# Patient Record
Sex: Female | Born: 1990 | Race: Black or African American | Hispanic: No | Marital: Single | Smoking: Never smoker
Health system: Southern US, Community
[De-identification: ages and names within clinical notes are randomized; demographics above are authoritative.]

## PROBLEM LIST (undated history)

## (undated) DIAGNOSIS — O039 Complete or unspecified spontaneous abortion without complication: Secondary | ICD-10-CM

## (undated) DIAGNOSIS — N289 Disorder of kidney and ureter, unspecified: Secondary | ICD-10-CM

## (undated) DIAGNOSIS — R718 Other abnormality of red blood cells: Secondary | ICD-10-CM

## (undated) HISTORY — PX: OTHER SURGICAL HISTORY: SHX169

---

## 2012-02-29 DIAGNOSIS — Z8614 Personal history of Methicillin resistant Staphylococcus aureus infection: Secondary | ICD-10-CM | POA: Insufficient documentation

## 2017-08-19 ENCOUNTER — Encounter (HOSPITAL_COMMUNITY): Payer: Self-pay

## 2017-08-19 ENCOUNTER — Emergency Department (HOSPITAL_COMMUNITY)
Admission: EM | Admit: 2017-08-19 | Discharge: 2017-08-19 | Disposition: A | Discharge status: Home / Self Care | Payer: Self-pay | Attending: Emergency Medicine | Admitting: Emergency Medicine

## 2017-08-19 DIAGNOSIS — M5441 Lumbago with sciatica, right side: Secondary | ICD-10-CM

## 2017-08-19 DIAGNOSIS — M5432 Sciatica, left side: Secondary | ICD-10-CM | POA: Insufficient documentation

## 2017-08-19 DIAGNOSIS — M545 Low back pain: Secondary | ICD-10-CM | POA: Insufficient documentation

## 2017-08-19 DIAGNOSIS — M5442 Lumbago with sciatica, left side: Secondary | ICD-10-CM

## 2017-08-19 DIAGNOSIS — R809 Proteinuria, unspecified: Secondary | ICD-10-CM | POA: Insufficient documentation

## 2017-08-19 DIAGNOSIS — M5431 Sciatica, right side: Secondary | ICD-10-CM | POA: Insufficient documentation

## 2017-08-19 LAB — URINALYSIS, ROUTINE W REFLEX MICROSCOPIC
BACTERIA UA: NONE SEEN
BILIRUBIN URINE: NEGATIVE
Glucose, UA: NEGATIVE mg/dL
HGB URINE DIPSTICK: NEGATIVE
Ketones, ur: NEGATIVE mg/dL
LEUKOCYTES UA: NEGATIVE
NITRITE: NEGATIVE
Specific Gravity, Urine: 1.018 (ref 1.005–1.030)
pH: 8 (ref 5.0–8.0)

## 2017-08-19 LAB — POC URINE PREG, ED: Preg Test, Ur: NEGATIVE

## 2017-08-19 MED ORDER — IBUPROFEN 800 MG PO TABS: 800.0000 mg | ORAL_TABLET | Freq: Three times a day (TID) | ORAL | 0 refills | Status: DC | PRN

## 2017-08-19 NOTE — ED Provider Notes (Signed)
[facility]-EMERGENCY DEPT Provider Note   CSN: [id number] Arrival date & time: 08/19/17  [id number]   History   Chief Complaint No chief complaint on file.  HPI  Ms. Diane Thomas is a 26yo female with no relevant PMH who presents with right lower back and bilateral leg numbness since yesterday evening. She reports that the right lower back pain woke her up from sleep last night. She went back to sleep and then woke up again feeling like her legs were completely numb and weak. She could not ambulate until a couple hours later when her leg numbness resolved and numbness now on bilateral hips. Describes the back pain as sharp and achy. Does endorse some lower abdominal pain. She denies fevers, chest pain, nausea/vomiting, or shortness of breath. Has been able to tolerate PO intake. Has frequent UTIs and did feel burning with urination, urinary frequency, and urinary urgency last week and took AZO for 1 week (last dose Thursday). The symptoms resolved. She has also had a kidney infection in the past and she states these symptoms are kind of similar.  Has had chronic intermittent headaches, blurry vision, and polydipsia and polyuria. Dad has diabetes. States she is supposed to be worked up for diabetes but she does not have a PCP and they have trouble getting blood so she has not had this done yet. States she has chronic proteinuria as well. Has been taking excedrin recently for the headaches with some relief.  Denies smoking or other illicit drugs. Endorses occasional alcohol use (~1x/month). 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  History reviewed. No pertinent past medical history.  There are no active problems to display for this patient.   History reviewed. No pertinent surgical history.  OB History    No data available       Home Medications    Prior to Admission medications   Medication Sig Start Date End Date Taking? Authorizing Provider  AZO-CRANBERRY PO Take 1 tablet by mouth daily as needed (cramps).   Yes  [provider]  ibuprofen (ADVIL,MOTRIN) 800 MG tablet Take 1 tablet (800 mg total) by mouth every 8 (eight) hours as needed. 08/19/17   Scherrie GerlachHuang, Ellen Goris, MD    Family History No family history on file.  Social History Social History  Substance Use Topics  . Smoking status: Never Smoker  . Smokeless tobacco: Never Used  . Alcohol use Not on file     Allergies   Sulfa antibiotics   Review of Systems Review of Systems  Negative except as stated above in HPI.  Physical Exam Updated Vital Signs BP (!) 150/90   Pulse 88   Temp 98.1 F (36.7 C) (Oral)   Resp 18   SpO2 100%   Physical Exam  GEN: Well-appearing, obese, pleasant young female sitting in bed in NAD. Alert and oriented. HENT: Moist mucous membranes. No visible lesions. EYES: PERRL. Sclera anicteric. RESP: Clear to auscultation bilaterally. No wheezes, rales, or rhonchi. CV: Normal rate and regular rhythm. No murmurs, gallops, or rubs. No LE edema. BACK: Mild soreness to palpation on R lower back. No CVA tenderness. Negative straight leg raise test. ABD: Soft. Non-tender. Non-distended. Normoactive bowel sounds. EXT: No edema. Warm and well perfused. NEURO: Cranial nerves II-XII grossly intact. 5/5 LE strength. Decreased sensation in right thigh and bilateral hips. Normal proprioception bilaterally. Speech fluent and appropriate. PSYCH: Patient is calm and pleasant. Appropriate affect. Well-groomed; speech is appropriate and on-subject.  ED Treatments / Results  Labs (all labs ordered are listed, but only  abnormal results are displayed) Labs Reviewed  URINALYSIS, ROUTINE W REFLEX MICROSCOPIC - Abnormal; Notable for the following:       Result Value   Protein, ur >=300 (*)    Squamous Epithelial / LPF 0-5 (*)    All other components within normal limits  POC URINE PREG, ED    EKG  EKG Interpretation None       Radiology No results found.  Procedures Procedures (including critical care  time)  Medications Ordered in ED Medications - No data to display   Initial Impression / Assessment and Plan / ED Course  I have reviewed the triage vital signs and the nursing notes.  Pertinent labs & imaging results that were available during my care of the patient were reviewed by me and considered in my medical decision making (see chart for details).  Diane Thomas is a 26yo female with frequent UTIs and prior pyelonephritis who had right lower back pain and bilateral leg numbness that started yesterday. It sounds like she had a UTI a week ago and self-treated with AZO with resolution of her urinary symptoms. No CVA tenderness or urinary symptoms now either. Patient afebrile and denies nausea. Able to tolerate PO intake. Less concerned for pyelonephritis, but will check UA. May be MSK pain or ?related to diabetes. Patient also states that she just recently eliminated pork and beef from her diet. Another concern is folate/B12 deficiency however I think this is very unlikely. She can get a work-up for this outpatient if indicated.  She also has had chronic headaches, intermittent blurry vision, polydipsia and polyuria. She states this has been going on for as long as she can remember. Dad has diabetes and she is supposed to be worked up for diabetes, but has not had this done yet. She likely does have diabetes, and will need work-up outpatient. Does not have a PCP.  10:52am UA negative for UTI, positive for proteinuria (chronic). Reassured that we do not feel that this is a kidney infection at this point. Provided ibuprofen  q8h PRN for pain and advised of return precautions. She really needs an outpatient primary doctor for further evaluation for this back pain and diabetes. Patient is stable for discharge.  Patient seen and discussed with Dr. Criss Alvine.  Final Clinical Impressions(s) / ED Diagnoses   Final diagnoses:  Proteinuria, unspecified type  Acute right-sided low back pain with  bilateral sciatica    New Prescriptions New Prescriptions   IBUPROFEN (ADVIL,MOTRIN) 800 MG TABLET    Take 1 tablet (800 mg total) by mouth every 8 (eight) hours as needed.     Scherrie Gerlach, MD 08/19/17 1054    Pricilla Loveless, MD 08/19/17 [phone]

## 2017-08-19 NOTE — ED Notes (Signed)
Education given to patient regarding establishing primary care and getting screening for diabetes. Resources given to patient.

## 2017-08-19 NOTE — Discharge Instructions (Signed)
You do not have a urinary tract infection. We are not sure what is causing your back pain, but you can take ibuprofen  three times a day as needed for the pain. It is important that you follow up with a primary care doctor for further evaluation of this back pain as well as getting labs for diabetes. Please make an appointment with a primary care doctor as soon as you can.  If you start having fevers, chills, nausea, vomiting, and cannot tolerate oral intake, please call for help.

## 2017-08-19 NOTE — ED Triage Notes (Signed)
Patient complains of back pain and bilateral leg pain since yesterday, thinsk she may have uti. NAD

## 2017-10-24 ENCOUNTER — Other Ambulatory Visit: Payer: Self-pay | Admitting: Nephrology

## 2017-10-24 DIAGNOSIS — R809 Proteinuria, unspecified: Secondary | ICD-10-CM

## 2017-10-28 ENCOUNTER — Ambulatory Visit
Admission: RE | Admit: 2017-10-28 | Discharge: 2017-10-28 | Disposition: A | Discharge status: Home / Self Care | Payer: BLUE CROSS/BLUE SHIELD | Source: Ambulatory Visit | Attending: Nephrology | Admitting: Nephrology

## 2017-10-28 DIAGNOSIS — R809 Proteinuria, unspecified: Secondary | ICD-10-CM

## 2017-11-24 ENCOUNTER — Other Ambulatory Visit (HOSPITAL_COMMUNITY): Payer: Self-pay | Admitting: Nephrology

## 2017-11-24 DIAGNOSIS — R809 Proteinuria, unspecified: Secondary | ICD-10-CM

## 2017-12-03 ENCOUNTER — Inpatient Hospital Stay (HOSPITAL_COMMUNITY): Payer: BLUE CROSS/BLUE SHIELD

## 2017-12-03 ENCOUNTER — Encounter (HOSPITAL_COMMUNITY): Payer: Self-pay

## 2017-12-03 ENCOUNTER — Inpatient Hospital Stay (HOSPITAL_COMMUNITY)
Admission: AD | Admit: 2017-12-03 | Discharge: 2017-12-03 | Disposition: A | Discharge status: Home / Self Care | Payer: BLUE CROSS/BLUE SHIELD | Source: Ambulatory Visit | Attending: Family Medicine | Admitting: Family Medicine

## 2017-12-03 DIAGNOSIS — O3680X0 Pregnancy with inconclusive fetal viability, not applicable or unspecified: Secondary | ICD-10-CM

## 2017-12-03 DIAGNOSIS — O0901 Supervision of pregnancy with history of infertility, first trimester: Secondary | ICD-10-CM | POA: Insufficient documentation

## 2017-12-03 DIAGNOSIS — O26891 Other specified pregnancy related conditions, first trimester: Secondary | ICD-10-CM | POA: Insufficient documentation

## 2017-12-03 DIAGNOSIS — R109 Unspecified abdominal pain: Secondary | ICD-10-CM | POA: Insufficient documentation

## 2017-12-03 DIAGNOSIS — Z3A00 Weeks of gestation of pregnancy not specified: Secondary | ICD-10-CM | POA: Insufficient documentation

## 2017-12-03 DIAGNOSIS — O209 Hemorrhage in early pregnancy, unspecified: Secondary | ICD-10-CM | POA: Diagnosis not present

## 2017-12-03 DIAGNOSIS — N289 Disorder of kidney and ureter, unspecified: Secondary | ICD-10-CM | POA: Insufficient documentation

## 2017-12-03 DIAGNOSIS — O9989 Other specified diseases and conditions complicating pregnancy, childbirth and the puerperium: Secondary | ICD-10-CM | POA: Diagnosis not present

## 2017-12-03 HISTORY — DX: Disorder of kidney and ureter, unspecified: N28.9

## 2017-12-03 HISTORY — DX: Other abnormality of red blood cells: R71.8

## 2017-12-03 LAB — URINALYSIS, ROUTINE W REFLEX MICROSCOPIC
Bilirubin Urine: NEGATIVE
Glucose, UA: NEGATIVE mg/dL
Ketones, ur: NEGATIVE mg/dL
Nitrite: NEGATIVE
PH: 5 (ref 5.0–8.0)
Protein, ur: >=300 mg/dL — AB
SPECIFIC GRAVITY, URINE: 1.018 (ref 1.005–1.030)

## 2017-12-03 LAB — ABO/RH: ABO/RH(D): O POS

## 2017-12-03 LAB — WET PREP, GENITAL
CLUE CELLS WET PREP: NONE SEEN
SPERM: NONE SEEN
TRICH WET PREP: NONE SEEN
YEAST WET PREP: NONE SEEN

## 2017-12-03 LAB — CBC
HEMATOCRIT: 33.8 % — AB (ref 36.0–46.0)
Hemoglobin: 10.5 g/dL — ABNORMAL LOW (ref 12.0–15.0)
MCH: 22.8 pg — ABNORMAL LOW (ref 26.0–34.0)
MCHC: 31.1 g/dL (ref 30.0–36.0)
MCV: 73.3 fL — AB (ref 78.0–100.0)
PLATELETS: 299 10*3/uL (ref 150–400)
RBC: 4.61 MIL/uL (ref 3.87–5.11)
RDW: 16.7 % — ABNORMAL HIGH (ref 11.5–15.5)
WBC: 8.3 10*3/uL (ref 4.0–10.5)

## 2017-12-03 LAB — HCG, QUANTITATIVE, PREGNANCY: hCG, Beta Chain, Quant, S: 169 m[IU]/mL — ABNORMAL HIGH (ref <5)

## 2017-12-03 LAB — POCT PREGNANCY, URINE: PREG TEST UR: POSITIVE — AB

## 2017-12-03 NOTE — MAU Provider Note (Signed)
Chief Complaint: Abdominal Pain and Vaginal Bleeding   First Provider Initiated Contact with Patient 12/03/17 1105      SUBJECTIVE HPI: Diane Thomas is a 26 y.o. G2P0010 at [redacted]w[redacted]d by LMP who presents to maternity admissions reporting onset of cramping 4 days ago and vaginal  bleeding yesterday.  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  Her periods are irregular. She had a regular period in November and then 3 days of light bleeding 12/13-15.  Last week she had a positive home pregnancy test and confirmed this with an urgent care visit with positive test.  The cramping started 4 days ago and is light, intermittent, low in her abdomen and in the middle and is described as menstrual cramping. The bleeding started yesterday, 12/25, and was light only when wiping. She did notice heavier bright red bleeding last night after a bowel movement but it has return to light this morning.  She has not tried any treatments.  She denies vaginal itching/burning, urinary symptoms, h/a, dizziness, n/v, or fever/chills.     HPI  Past Medical History:  Diagnosis Date  . Abnormal RBC    states it is low  . Kidney disease    Past Surgical History:  Procedure Laterality Date  . boil removed     Social History   Socioeconomic History  . Marital status: Single    Spouse name: Not on file  . Number of children: Not on file  . Years of education: Not on file  . Highest education level: Not on file  Social Needs  . Financial resource strain: Not on file  . Food insecurity - worry: Not on file  . Food insecurity - inability: Not on file  . Transportation needs - medical: Not on file  . Transportation needs - non-medical: Not on file  Occupational History  . Not on file  Tobacco Use  . Smoking status: Never Smoker  . Smokeless tobacco: Never Used  Substance and Sexual Activity  . Alcohol use: No    Frequency: Never  . Drug use: No  . Sexual activity: Yes    Birth control/protection: None   Other Topics Concern  . Not on file  Social History Narrative  . Not on file   No current facility-administered medications on file prior to encounter.    Current Outpatient Medications on File Prior to Encounter  Medication Sig Dispense Refill  . Prenatal Vit-Fe Fumarate-FA (PRENATAL MULTIVITAMIN) TABS tablet Take 1 tablet by mouth daily at 12 noon.    Marland Kitchen ibuprofen (ADVIL,MOTRIN) 800 MG tablet Take 1 tablet (800 mg total) by mouth every 8 (eight) hours as needed. (Patient not taking: Reported on 11/28/2017) 30 tablet 0   Allergies  Allergen Reactions  . Sulfa Antibiotics Rash    ROS:  Review of Systems  Constitutional: Negative for chills, fatigue and fever.  Respiratory: Negative for shortness of breath.   Cardiovascular: Negative for chest pain.  Gastrointestinal: Negative for nausea and vomiting.  Genitourinary: Positive for pelvic pain and vaginal bleeding. Negative for difficulty urinating, dysuria, flank pain, vaginal discharge and vaginal pain.  Neurological: Negative for dizziness and headaches.  Psychiatric/Behavioral: Negative.      I have reviewed patient's Past Medical Hx, Surgical Hx, Family Hx, Social Hx, medications and allergies.   Physical Exam   Patient Vitals for the past 24 hrs:  BP Temp Temp src Pulse Resp Height Weight  12/03/17 1434 133/82 - - - - - -  12/03/17 1057 140/80  98.2 F (36.8 C) Oral 91 18 - -  12/03/17 1036 - - - - - 5\' 6"  (1.676 m) (!) 314 lb 4 oz (142.5 kg)   Constitutional: Well-developed, well-nourished female in no acute distress.  Cardiovascular: normal rate Respiratory: normal effort GI: Abd soft, non-tender. Pos BS x 4 MS: Extremities nontender, no edema, normal ROM Neurologic: Alert and oriented x 4.  GU: Neg CVAT.  PELVIC EXAM: Cervix pink, visually closed, without lesion, small amount pink bleeding with mucus, vaginal walls and external genitalia normal Bimanual exam: Cervix 0/long/high, firm, anterior, neg CMT,  technically difficult exam due to body habitus but no palpable abnormalities  LAB RESULTS Results for orders placed or performed during the hospital encounter of 12/03/17 (from the past 24 hour(s))  Urinalysis, Routine w reflex microscopic     Status: Abnormal   Collection Time: 12/03/17 10:35 AM  Result Value Ref Range   Color, Urine YELLOW YELLOW   APPearance HAZY (A) CLEAR   Specific Gravity, Urine 1.018 1.005 - 1.030   pH 5.0 5.0 - 8.0   Glucose, UA NEGATIVE NEGATIVE mg/dL   Hgb urine dipstick LARGE (A) NEGATIVE   Bilirubin Urine NEGATIVE NEGATIVE   Ketones, ur NEGATIVE NEGATIVE mg/dL   Protein, ur >=811 (A) NEGATIVE mg/dL   Nitrite NEGATIVE NEGATIVE   Leukocytes, UA TRACE (A) NEGATIVE   RBC / HPF TOO NUMEROUS TO COUNT 0 - 5 RBC/hpf   WBC, UA 6-30 0 - 5 WBC/hpf   Bacteria, UA RARE (A) NONE SEEN   Squamous Epithelial / LPF 6-30 (A) NONE SEEN   Mucus PRESENT   Pregnancy, urine POC     Status: Abnormal   Collection Time: 12/03/17 10:44 AM  Result Value Ref Range   Preg Test, Ur POSITIVE (A) NEGATIVE  Wet prep, genital     Status: Abnormal   Collection Time: 12/03/17 11:10 AM  Result Value Ref Range   Yeast Wet Prep HPF POC NONE SEEN NONE SEEN   Trich, Wet Prep NONE SEEN NONE SEEN   Clue Cells Wet Prep HPF POC NONE SEEN NONE SEEN   WBC, Wet Prep HPF POC MANY (A) NONE SEEN   Sperm NONE SEEN   CBC     Status: Abnormal   Collection Time: 12/03/17 11:48 AM  Result Value Ref Range   WBC 8.3 4.0 - 10.5 K/uL   RBC 4.61 3.87 - 5.11 MIL/uL   Hemoglobin 10.5 (L) 12.0 - 15.0 g/dL   HCT 91.4 (L) 78.2 - 95.6 %   MCV 73.3 (L) 78.0 - 100.0 fL   MCH 22.8 (L) 26.0 - 34.0 pg   MCHC 31.1 30.0 - 36.0 g/dL   RDW 21.3 (H) 08.6 - 57.8 %   Platelets 299 150 - 400 K/uL  hCG, quantitative, pregnancy     Status: Abnormal   Collection Time: 12/03/17 11:48 AM  Result Value Ref Range   hCG, Beta Chain, Quant, S 169 (H) <5 mIU/mL  ABO/Rh     Status: None   Collection Time: 12/03/17 11:48 AM   Result Value Ref Range   ABO/RH(D) O POS     --/--/O POS (12/26 1148)  IMAGING US Ob Comp Less 14 Wks  Result Date: 12/03/2017 CLINICAL DATA:  Lower abdominal cramping EXAM: OBSTETRIC <14 WK [place] AND TRANSVAGINAL OB US TECHNIQUE: Both transabdominal and transvaginal ultrasound examinations were performed for complete evaluation of the gestation as well as the maternal uterus, adnexal regions, and pelvic cul-de-sac. Transvaginal technique was performed to assess  early pregnancy. COMPARISON:  None. FINDINGS: Intrauterine gestational sac: None visualized Yolk sac:  None visualized Embryo:  None visualized Cardiac Activity: Heart Rate:   bpm MSD:   mm    w     d CRL:    mm    w    d                  US EDC: Subchorionic hemorrhage:  None visualized. Maternal uterus/adnexae: No adnexal mass or free fluid. IMPRESSION: No intrauterine pregnancy visualized. Differential considerations would include early intrauterine pregnancy too early to visualize, spontaneous abortion, or occult ectopic pregnancy. Recommend close clinical followup and serial quantitative beta HCGs and ultrasounds. Electronically Signed   By: Charlett NoseKevin  Dover M.D.   On: 12/03/2017 13:10   [place] Ob Transvaginal  Result Date: 12/03/2017 CLINICAL DATA:  Lower abdominal cramping EXAM: OBSTETRIC <14 WK US AND TRANSVAGINAL OB US TECHNIQUE: Both transabdominal and transvaginal ultrasound examinations were performed for complete evaluation of the gestation as well as the maternal uterus, adnexal regions, and pelvic cul-de-sac. Transvaginal technique was performed to assess early pregnancy. COMPARISON:  None. FINDINGS: Intrauterine gestational sac: None visualized Yolk sac:  None visualized Embryo:  None visualized Cardiac Activity: Heart Rate:   bpm MSD:   mm    w     d CRL:    mm    w    d                  US EDC: Subchorionic hemorrhage:  None visualized. Maternal uterus/adnexae: No adnexal mass or free fluid. IMPRESSION: No intrauterine pregnancy  visualized. Differential considerations would include early intrauterine pregnancy too early to visualize, spontaneous abortion, or occult ectopic pregnancy. Recommend close clinical followup and serial quantitative beta HCGs and ultrasounds. Electronically Signed   By: Charlett NoseKevin  Dover M.D.   On: 12/03/2017 13:10    MAU Management/MDM: Ordered labs and reviewed results.  Findings today could represent a normal early pregnancy, spontaneous abortion or ectopic pregnancy which can be life-threatening.  Ectopic precautions were given to the patient with plan to return in 48 hours for repeat quant hcg to evaluate pregnancy development.  [facility] 8 am on 12/05/17 for repeat hcg.  Pt with >300 protein in her urine today and borderline BP. She reports her primary care referred her to nephrology for protein in urine x 1 year. Nephrology wants a renal biopsy but because of pregnancy test positive, cancelled her scheduled biopsy. Pt to follow up with nephrology and primary care after Friday's lab visit and results.  Pt discharged with strict ectopic and bleeding precautions.  ASSESSMENT 1. Pregnancy of unknown anatomic location   2. Vaginal bleeding in pregnancy, first trimester     PLAN Discharge home Allergies as of 12/03/2017      Reactions   Sulfa Antibiotics Rash      Medication List    STOP taking these medications   aspirin-acetaminophen-caffeine 250-250-65 MG tablet Commonly known as:  EXCEDRIN MIGRAINE   MIDOL MAX ST MENSTRUAL 500-60-15 MG Tabs Generic drug:  Acetaminophen-Caff-Pyrilamine     TAKE these medications   prenatal multivitamin Tabs tablet Take 1 tablet by mouth daily at 12 noon.     ASK your doctor about these medications   ibuprofen 800 MG tablet Commonly known as:  ADVIL,MOTRIN Take 1 tablet (800 mg total) by mouth every 8 (eight) hours as needed.      Follow-up Information  [facility]   Specialty:   Obstetrics and Gynecology Why:  [facility] to the office on the ground floor for repeat labs on 12/05/17 at 8 am.  You will stay for 1-2 hours for results. Return to MAU with any emergencies. Contact information: [address] [place] [id number] [phone]          Sharen CounterLisa Leftwich-Kirby Certified Nurse-Midwife 12/03/2017  9:09 PM

## 2017-12-03 NOTE — MAU Note (Signed)
Took home pregnancy test last week. States she has fertility issues and was told she couldn't get pregnant. Started seeing some spotting when wiped yesterday was pink, in the middle of the night when she went to the bathroom she saw a darker red blood in the toilet, now back to pink. Cramping since saturday

## 2017-12-04 LAB — HIV ANTIBODY (ROUTINE TESTING W REFLEX): HIV Screen 4th Generation wRfx: NONREACTIVE

## 2017-12-04 LAB — GC/CHLAMYDIA PROBE AMP (~~LOC~~) NOT AT ARMC
CHLAMYDIA, DNA PROBE: NEGATIVE
Neisseria Gonorrhea: NEGATIVE

## 2017-12-05 ENCOUNTER — Ambulatory Visit (HOSPITAL_COMMUNITY)
Admission: RE | Admit: 2017-12-05 | Discharge: 2017-12-05 | Disposition: A | Discharge status: Home / Self Care | Payer: BLUE CROSS/BLUE SHIELD | Source: Ambulatory Visit | Attending: Nephrology | Admitting: Nephrology

## 2017-12-05 ENCOUNTER — Ambulatory Visit (INDEPENDENT_AMBULATORY_CARE_PROVIDER_SITE_OTHER): Payer: BLUE CROSS/BLUE SHIELD | Admitting: *Deleted

## 2017-12-05 ENCOUNTER — Encounter (HOSPITAL_COMMUNITY): Payer: Self-pay

## 2017-12-05 DIAGNOSIS — Z3490 Encounter for supervision of normal pregnancy, unspecified, unspecified trimester: Secondary | ICD-10-CM

## 2017-12-05 DIAGNOSIS — O3680X0 Pregnancy with inconclusive fetal viability, not applicable or unspecified: Secondary | ICD-10-CM

## 2017-12-05 LAB — HCG, QUANTITATIVE, PREGNANCY: hCG, Beta Chain, Quant, S: 91 m[IU]/mL — ABNORMAL HIGH (ref <5)

## 2017-12-05 NOTE — Progress Notes (Signed)
Chart reviewed for nurse visit. Agree with plan of care.   Sharyon CableRogers, Zo Loudon C, CNM 12/05/2017 10:35 AM

## 2017-12-05 NOTE — Progress Notes (Signed)
Here for stat bhcg. Denies bleeding or pain. Explained will draw blood and run stat and have her wait for results , about 1.5 hours. Then review with provider and her. She voices understanding.   Reviewed results with Steward DroneVeronica Rogers, CNM and patient. Informed her appears she has had a miscarriage and plan is to come back 1 week for routine bhcg, 2 weeks for provider visit for follow up.  Ectopic precautions given. Support given. She voices understanding.

## 2017-12-12 ENCOUNTER — Other Ambulatory Visit: Payer: BLUE CROSS/BLUE SHIELD

## 2017-12-12 DIAGNOSIS — O3680X0 Pregnancy with inconclusive fetal viability, not applicable or unspecified: Secondary | ICD-10-CM

## 2017-12-12 NOTE — Addendum Note (Signed)
Addended by: Lorelle GibbsWILSON, Channing Savich L on: 12/12/2017 09:20 AM   Modules accepted: Orders

## 2017-12-13 LAB — BETA HCG QUANT (REF LAB): HCG QUANT: 3 m[IU]/mL

## 2017-12-16 ENCOUNTER — Other Ambulatory Visit (HOSPITAL_COMMUNITY): Payer: Self-pay | Admitting: Nephrology

## 2017-12-16 DIAGNOSIS — R809 Proteinuria, unspecified: Secondary | ICD-10-CM

## 2017-12-29 ENCOUNTER — Ambulatory Visit: Payer: BLUE CROSS/BLUE SHIELD | Admitting: Family Medicine

## 2017-12-29 ENCOUNTER — Encounter: Payer: Self-pay | Admitting: Family Medicine

## 2017-12-29 VITALS — BP 126/82 | HR 82 | Ht 66.0 in | Wt 312.3 lb

## 2017-12-29 DIAGNOSIS — O039 Complete or unspecified spontaneous abortion without complication: Secondary | ICD-10-CM

## 2017-12-29 NOTE — Progress Notes (Addendum)
   Subjective:    Patient ID: Diane Thomas, female    DOB: Mar 12, 1991, 27 y.o.   MRN: [id number]  HPI Seen for follow up of SAB two weeks ago. No bleeding, cramping. Menses have not returned yet. Not planing on becoming pregnant soon.    Review of Systems     Objective:   Physical Exam  Constitutional: She is oriented to person, place, and time. She appears well-developed and well-nourished.  Cardiovascular: Normal rate.  Abdominal: Soft. She exhibits no distension.  Neurological: She is alert and oriented to person, place, and time.  Skin: Skin is warm and dry.  Psychiatric: She has a normal mood and affect. Her behavior is normal. Judgment and thought content normal.      Assessment & Plan:  1. SAB (spontaneous abortion) Counseled pt not to become pregnant over the next 3 months. Pt declined contraception - will use condoms  2. Morbid obesity (HCC) Discussed weight loss, DM screening.

## 2017-12-30 ENCOUNTER — Other Ambulatory Visit: Payer: Self-pay | Admitting: Student

## 2017-12-30 ENCOUNTER — Ambulatory Visit (HOSPITAL_COMMUNITY): Payer: BLUE CROSS/BLUE SHIELD

## 2017-12-31 ENCOUNTER — Other Ambulatory Visit: Payer: Self-pay | Admitting: Radiology

## 2018-01-01 ENCOUNTER — Other Ambulatory Visit: Payer: Self-pay | Admitting: Radiology

## 2018-01-02 ENCOUNTER — Encounter (HOSPITAL_COMMUNITY): Payer: Self-pay

## 2018-01-02 ENCOUNTER — Ambulatory Visit (HOSPITAL_COMMUNITY)
Admission: RE | Admit: 2018-01-02 | Discharge: 2018-01-02 | Disposition: A | Discharge status: Home / Self Care | Payer: BLUE CROSS/BLUE SHIELD | Source: Ambulatory Visit | Attending: Nephrology | Admitting: Nephrology

## 2018-01-02 DIAGNOSIS — Z882 Allergy status to sulfonamides status: Secondary | ICD-10-CM | POA: Insufficient documentation

## 2018-01-02 DIAGNOSIS — N189 Chronic kidney disease, unspecified: Secondary | ICD-10-CM | POA: Insufficient documentation

## 2018-01-02 DIAGNOSIS — R809 Proteinuria, unspecified: Secondary | ICD-10-CM | POA: Insufficient documentation

## 2018-01-02 DIAGNOSIS — Z79899 Other long term (current) drug therapy: Secondary | ICD-10-CM | POA: Insufficient documentation

## 2018-01-02 LAB — COMPREHENSIVE METABOLIC PANEL
ALT: 16 U/L (ref 14–54)
AST: 16 U/L (ref 15–41)
Albumin: 3.4 g/dL — ABNORMAL LOW (ref 3.5–5.0)
Alkaline Phosphatase: 56 U/L (ref 38–126)
Anion gap: 12 (ref 5–15)
BUN: 17 mg/dL (ref 6–20)
CO2: 20 mmol/L — AB (ref 22–32)
CREATININE: 0.87 mg/dL (ref 0.44–1.00)
Calcium: 9 mg/dL (ref 8.9–10.3)
Chloride: 106 mmol/L (ref 101–111)
GFR calc non Af Amer: >60 mL/min (ref >60)
Glucose, Bld: 102 mg/dL — ABNORMAL HIGH (ref 65–99)
Potassium: 4.3 mmol/L (ref 3.5–5.1)
SODIUM: 138 mmol/L (ref 135–145)
Total Bilirubin: 0.4 mg/dL (ref 0.3–1.2)
Total Protein: 7 g/dL (ref 6.5–8.1)

## 2018-01-02 LAB — CBC
HEMATOCRIT: 37.2 % (ref 36.0–46.0)
HEMOGLOBIN: 11.5 g/dL — AB (ref 12.0–15.0)
MCH: 23.4 pg — ABNORMAL LOW (ref 26.0–34.0)
MCHC: 30.9 g/dL (ref 30.0–36.0)
MCV: 75.8 fL — ABNORMAL LOW (ref 78.0–100.0)
Platelets: 265 10*3/uL (ref 150–400)
RBC: 4.91 MIL/uL (ref 3.87–5.11)
RDW: 18.5 % — AB (ref 11.5–15.5)
WBC: 8.5 10*3/uL (ref 4.0–10.5)

## 2018-01-02 LAB — PROTIME-INR
INR: 0.97
PROTHROMBIN TIME: 12.8 s (ref 11.4–15.2)

## 2018-01-02 LAB — APTT: APTT: 36 s (ref 24–36)

## 2018-01-02 LAB — PREGNANCY, URINE: PREG TEST UR: NEGATIVE

## 2018-01-02 MED ORDER — FENTANYL CITRATE (PF) 100 MCG/2ML IJ SOLN
INTRAMUSCULAR | Status: AC
  Filled 2018-01-02: qty 2

## 2018-01-02 MED ORDER — MIDAZOLAM HCL 2 MG/2ML IJ SOLN
INTRAMUSCULAR | Status: AC
  Filled 2018-01-02: qty 2

## 2018-01-02 MED ORDER — SODIUM CHLORIDE 0.9 % IV SOLN
INTRAVENOUS | Status: AC | PRN
  Administered 2018-01-02: 10 mL/h via INTRAVENOUS

## 2018-01-02 MED ORDER — MIDAZOLAM HCL 2 MG/2ML IJ SOLN
INTRAMUSCULAR | Status: AC | PRN
  Administered 2018-01-02 (×2): 1 mg via INTRAVENOUS
  Administered 2018-01-02 (×2): 0.5 mg via INTRAVENOUS

## 2018-01-02 MED ORDER — FENTANYL CITRATE (PF) 100 MCG/2ML IJ SOLN
INTRAMUSCULAR | Status: AC | PRN
  Administered 2018-01-02: 50 ug via INTRAVENOUS
  Administered 2018-01-02 (×2): 25 ug via INTRAVENOUS

## 2018-01-02 MED ORDER — LIDOCAINE HCL (PF) 1 % IJ SOLN
INTRAMUSCULAR | Status: AC
  Filled 2018-01-02: qty 30

## 2018-01-02 MED ORDER — SODIUM CHLORIDE 0.9 % IV SOLN: INTRAVENOUS | Status: DC

## 2018-01-02 NOTE — Sedation Documentation (Signed)
Patient is resting comfortably. 

## 2018-01-02 NOTE — Discharge Instructions (Addendum)

## 2018-01-02 NOTE — H&P (Signed)
Chief Complaint: Patient was seen in consultation today for random renal biopsy at the request of Dunham,Cynthia  Referring Physician(s): Dunham,Cynthia  Supervising Physician: Irish LackYamagata, Glenn  Patient Status: [facility]  History of Present Illness: Diane Thomas is a 27 y.o. female   Proteinuria for years New onset leg swelling; numbness Referred to Dr Eliott Nineunham Now scheduled for random renal bx   Past Medical History:  Diagnosis Date  . Abnormal RBC    states it is low  . Kidney disease     Past Surgical History:  Procedure Laterality Date  . boil removed      Allergies: Sulfa antibiotics  Medications: Prior to Admission medications   Medication Sig Start Date End Date Taking? Authorizing Provider  Biotin 5 MG TABS Take 1 tablet by mouth daily.   Yes [provider]     Family History  Problem Relation Age of Onset  . Diabetes Father   . Cancer Maternal Uncle   . Cancer Maternal Grandmother     Social History   Socioeconomic History  . Marital status: Single    Spouse name: None  . Number of children: None  . Years of education: None  . Highest education level: None  Social Needs  . Financial resource strain: None  . Food insecurity - worry: None  . Food insecurity - inability: None  . Transportation needs - medical: None  . Transportation needs - non-medical: None  Occupational History  . None  Tobacco Use  . Smoking status: Never Smoker  . Smokeless tobacco: Never Used  Substance and Sexual Activity  . Alcohol use: No    Frequency: Never  . Drug use: No  . Sexual activity: Yes    Birth control/protection: None  Other Topics Concern  . None  Social History Narrative  . None    Review of Systems: A 12 point ROS discussed and pertinent positives are indicated in the HPI above.  All other systems are negative.  Review of Systems  Constitutional: Negative for activity change, fatigue and fever.  Respiratory: Negative for  cough.   Cardiovascular: Negative for chest pain.  Gastrointestinal: Negative for abdominal pain.  Musculoskeletal: Negative for back pain and gait problem.  Neurological: Negative for weakness.  Psychiatric/Behavioral: Negative for behavioral problems and confusion.    Vital Signs: BP 108/84   Pulse 87   Temp 98.3 F (36.8 C) (Oral)   Resp 20   Ht 5\' 6"  (1.676 m)   Wt (!) 312 lb (141.5 kg)   LMP 12/02/2017   SpO2 98%   BMI 50.36 kg/m   Physical Exam  Constitutional: She is oriented to person, place, and time.  Cardiovascular: Normal rate, regular rhythm and normal heart sounds.  Pulmonary/Chest: Effort normal and breath sounds normal. She has no wheezes.  Abdominal: Soft. Bowel sounds are normal.  Musculoskeletal: Normal range of motion.  Neurological: She is alert and oriented to person, place, and time.  Skin: Skin is warm and dry.  Psychiatric: She has a normal mood and affect. Her behavior is normal. Judgment and thought content normal.  Nursing note and vitals reviewed.   Imaging: [place] Ob Comp Less 14 Wks  Result Date: 12/03/2017 CLINICAL DATA:  Lower abdominal cramping EXAM: OBSTETRIC <14 WK US AND TRANSVAGINAL OB US TECHNIQUE: Both transabdominal and transvaginal ultrasound examinations were performed for complete evaluation of the gestation as well as the maternal uterus, adnexal regions, and pelvic cul-de-sac. Transvaginal technique was performed to assess early pregnancy.  COMPARISON:  None. FINDINGS: Intrauterine gestational sac: None visualized Yolk sac:  None visualized Embryo:  None visualized Cardiac Activity: Heart Rate:   bpm MSD:   mm    w     d CRL:    mm    w    d                  [place] EDC: Subchorionic hemorrhage:  None visualized. Maternal uterus/adnexae: No adnexal mass or free fluid. IMPRESSION: No intrauterine pregnancy visualized. Differential considerations would include early intrauterine pregnancy too early to visualize, spontaneous abortion, or occult  ectopic pregnancy. Recommend close clinical followup and serial quantitative beta HCGs and ultrasounds. Electronically Signed   By: Charlett Nose M.D.   On: 12/03/2017 13:10   US Ob Transvaginal  Result Date: 12/03/2017 CLINICAL DATA:  Lower abdominal cramping EXAM: OBSTETRIC <14 WK [place] AND TRANSVAGINAL OB US TECHNIQUE: Both transabdominal and transvaginal ultrasound examinations were performed for complete evaluation of the gestation as well as the maternal uterus, adnexal regions, and pelvic cul-de-sac. Transvaginal technique was performed to assess early pregnancy. COMPARISON:  None. FINDINGS: Intrauterine gestational sac: None visualized Yolk sac:  None visualized Embryo:  None visualized Cardiac Activity: Heart Rate:   bpm MSD:   mm    w     d CRL:    mm    w    d                  [place] EDC: Subchorionic hemorrhage:  None visualized. Maternal uterus/adnexae: No adnexal mass or free fluid. IMPRESSION: No intrauterine pregnancy visualized. Differential considerations would include early intrauterine pregnancy too early to visualize, spontaneous abortion, or occult ectopic pregnancy. Recommend close clinical followup and serial quantitative beta HCGs and ultrasounds. Electronically Signed   By: Charlett Nose M.D.   On: 12/03/2017 13:10    Labs:  CBC: Recent Labs    12/03/17 1148  WBC 8.3  HGB 10.5*  HCT 33.8*  PLT 299    COAGS: No results for input(s): INR, APTT in the last 8760 hours.  BMP: No results for input(s): NA, K, CL, CO2, GLUCOSE, BUN, CALCIUM, CREATININE, GFRNONAA, GFRAA in the last 8760 hours.  Invalid input(s): CMP  LIVER FUNCTION TESTS: No results for input(s): BILITOT, AST, ALT, ALKPHOS, PROT, ALBUMIN in the last 8760 hours.  TUMOR MARKERS: No results for input(s): AFPTM, CEA, CA199, CHROMGRNA in the last 8760 hours.  Assessment and Plan:  Long standing proteinuria Now for random renal biopsy per Nephrology Risks and benefits discussed with the patient including, but  not limited to bleeding, infection, damage to adjacent structures or low yield requiring additional tests. All of the patient's questions were answered, patient is agreeable to proceed. Consent signed and in chart.   Thank you for this interesting consult.  I greatly enjoyed meeting Diane Thomas and look forward to participating in their care.  A copy of this report was sent to the requesting provider on this date.  Electronically Signed: Robet Leu, PA-C 01/02/2018, 7:13 AM   I spent a total of  30 Minutes   in face to face in clinical consultation, greater than 50% of which was counseling/coordinating care for random renal bx

## 2018-01-02 NOTE — Procedures (Signed)
Interventional Radiology Procedure Note  Procedure: Ultrasound guided renal biopsy  Complications: None  Estimated Blood Loss: < 10 mL  Findings: 16 G core biopsy x 2 of left renal cortex. Bedrest x 4 hours.  Jodi MarbleGlenn T. Fredia SorrowYamagata, M.D Pager:  [phone]

## 2018-01-13 ENCOUNTER — Encounter (HOSPITAL_COMMUNITY): Payer: Self-pay

## 2018-09-02 ENCOUNTER — Ambulatory Visit (HOSPITAL_COMMUNITY)
Admission: EM | Admit: 2018-09-02 | Discharge: 2018-09-02 | Disposition: A | Discharge status: Home / Self Care | Payer: BLUE CROSS/BLUE SHIELD | Attending: Family Medicine | Admitting: Family Medicine

## 2018-09-02 ENCOUNTER — Encounter (HOSPITAL_COMMUNITY): Payer: Self-pay | Admitting: Emergency Medicine

## 2018-09-02 ENCOUNTER — Other Ambulatory Visit: Payer: Self-pay

## 2018-09-02 DIAGNOSIS — N898 Other specified noninflammatory disorders of vagina: Secondary | ICD-10-CM | POA: Diagnosis not present

## 2018-09-02 DIAGNOSIS — R109 Unspecified abdominal pain: Secondary | ICD-10-CM | POA: Diagnosis not present

## 2018-09-02 DIAGNOSIS — Z79899 Other long term (current) drug therapy: Secondary | ICD-10-CM | POA: Diagnosis not present

## 2018-09-02 DIAGNOSIS — Z8614 Personal history of Methicillin resistant Staphylococcus aureus infection: Secondary | ICD-10-CM | POA: Insufficient documentation

## 2018-09-02 DIAGNOSIS — R350 Frequency of micturition: Secondary | ICD-10-CM | POA: Diagnosis not present

## 2018-09-02 DIAGNOSIS — Z3202 Encounter for pregnancy test, result negative: Secondary | ICD-10-CM | POA: Diagnosis not present

## 2018-09-02 LAB — POCT URINALYSIS DIP (DEVICE)
BILIRUBIN URINE: NEGATIVE
GLUCOSE, UA: NEGATIVE mg/dL
Ketones, ur: NEGATIVE mg/dL
LEUKOCYTES UA: NEGATIVE
Nitrite: NEGATIVE
Protein, ur: >=300 mg/dL — AB
UROBILINOGEN UA: 0.2 mg/dL (ref 0.0–1.0)
pH: 5.5 (ref 5.0–8.0)

## 2018-09-02 LAB — POCT PREGNANCY, URINE: Preg Test, Ur: NEGATIVE

## 2018-09-02 NOTE — Discharge Instructions (Signed)
Urine did not show signs of infection today. We are testing you for Gonorrhea, Chlamydia, Trichomonas, Yeast and Bacterial Vaginosis. We will call you if anything is positive and let you know if you require any further treatment. Please inform partners of any positive results.   Please return if symptoms not improving with treatment, development of fever, nausea, vomiting, abdominal pain.

## 2018-09-02 NOTE — ED Triage Notes (Signed)
Pt reports urinary frequency, vaginal discharge and abdominal cramping x5 days.  Pt would like to be screened for STD's as well as a UTI.

## 2018-09-03 NOTE — ED Provider Notes (Signed)
[facility]    CSN: [id number] Arrival date & time: 09/02/18  1928     History   Chief Complaint Chief Complaint  Patient presents with  . Vaginal Discharge  . Abdominal Cramping  . Urinary Frequency    HPI Diane Thomas is a 27 y.o. female no contributing past medical history presenting today for evaluation of possible UTI/STD screening.  Patient states that couple days ago she developed abdominal pressure in her lower abdominal area.  Since this has resolved, but she is also noticed a discharge that has a slight yellow discoloration.  She is also had urinary frequency.  She notes that she has a history of frequent UTIs and initially thought her symptoms were related to this.  But she would also like to be screened for STDs.  Last menstrual period was approximately 9/6, patient is not on any form of birth control.  Denies fever, nausea, vomiting.  Denies changes in bowel movements.  HPI  Past Medical History:  Diagnosis Date  . Abnormal RBC    states it is low  . Kidney disease     Patient Active Problem List   Diagnosis Date Noted  . 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 boil removed      OB History    Gravida  2   Para      Term      Preterm      AB  1   Living        SAB  1   TAB      Ectopic      Multiple      Live Births               Home Medications    Prior to Admission medications   Medication Sig Start Date End Date Taking? Authorizing Provider  amLODipine (NORVASC) 5 MG tablet Take 5 mg by mouth 2 (two) times daily. 08/19/18  Yes [provider]  atorvastatin (LIPITOR) 20 MG tablet Take 20 mg by mouth daily. 08/14/18  Yes [provider]  labetalol (NORMODYNE) 100 MG tablet Take 100 mg by mouth daily. 08/13/18  Yes [provider]  valACYclovir (VALTREX) 1000 MG tablet as needed. 08/04/18  Yes [provider]  Biotin 5 MG TABS Take 1 tablet  by mouth daily.    [provider]    Family History Family History  Problem Relation Age of Onset  . Diabetes Father   . Cancer Maternal Uncle   . Cancer Maternal Grandmother     Social History Social History   Tobacco Use  . Smoking status: Never Smoker  . Smokeless tobacco: Never Used  Substance Use Topics  . Alcohol use: No    Frequency: Never  . Drug use: No     Allergies   Lisinopril and Sulfa antibiotics   Review of Systems Review of Systems  Constitutional: Negative for fever.  Respiratory: Negative for shortness of breath.   Cardiovascular: Negative for chest pain.  Gastrointestinal: Positive for abdominal pain. Negative for diarrhea, nausea and vomiting.  Genitourinary: Positive for frequency and vaginal discharge. Negative for dysuria, flank pain, genital sores, hematuria, menstrual problem, urgency, vaginal bleeding and vaginal pain.  Musculoskeletal: Negative for back pain.  Skin: Negative for rash.  Neurological: Negative for dizziness, light-headedness and headaches.     Physical Exam Triage Vital Signs ED Triage Vitals [09/02/18 2001]  Enc Vitals Group  BP 130/86     Pulse Rate 77     Resp      Temp 98.6 F (37 C)     Temp Source Oral     SpO2 100 %     Weight      Height      Head Circumference      Peak Flow      Pain Score 0     Pain Loc      Pain Edu?      Excl. in GC?    No data found.  Updated Vital Signs BP 130/86 (BP Location: Left Arm)   Pulse 77   Temp 98.6 F (37 C) (Oral)   LMP 08/10/2018 (Exact Date)   SpO2 100%   Visual Acuity Right Eye Distance:   Left Eye Distance:   Bilateral Distance:    Right Eye Near:   Left Eye Near:    Bilateral Near:     Physical Exam  Constitutional: She is oriented to person, place, and time. She appears well-developed and well-nourished.  No acute distress  HENT:  Head: Normocephalic and atraumatic.  Nose: Nose normal.  Eyes: Conjunctivae are normal.  Neck:  Neck supple.  Cardiovascular: Normal rate.  Pulmonary/Chest: Effort normal. No respiratory distress.  Abdominal: Soft. She exhibits no distension. There is no tenderness.  Nontender to light deep palpation throughout all 4 quadrants of abdomen  Genitourinary:  Genitourinary Comments: Normal female external genitalia, whitish-yellowish discharge in vaginal vault, cervix nontender  Musculoskeletal: Normal range of motion.  Neurological: She is alert and oriented to person, place, and time.  Skin: Skin is warm and dry.  Psychiatric: She has a normal mood and affect.  Nursing note and vitals reviewed.    UC Treatments / Results  Labs (all labs ordered are listed, but only abnormal results are displayed) Labs Reviewed  POCT URINALYSIS DIP (DEVICE) - Abnormal; Notable for the following components:      Result Value   Hgb urine dipstick SMALL (*)    Protein, ur >=300 (*)    All other components within normal limits  POCT PREGNANCY, URINE  CERVICOVAGINAL ANCILLARY ONLY    EKG None  Radiology No results found.  Procedures Procedures (including critical care time)  Medications Ordered in UC Medications - No data to display  Initial Impression / Assessment and Plan / UC Course  I have reviewed the triage vital signs and the nursing notes.  Pertinent labs & imaging results that were available during my care of the patient were reviewed by me and considered in my medical decision making (see chart for details).     Urine negative for signs of infection, vaginal swab obtained to send off to check for STDs as well as yeast and BV as cause of discharge.  Patient wanting to hold off on treatment until results obtained.  We will call patient with results and alter treatment as needed.  Her abdominal discomfort as relatively resolved, but advised her to return if developing worsening abdominal pain, fever, nausea, vomiting.Discussed strict return precautions. Patient verbalized  understanding and is agreeable with plan.  Final Clinical Impressions(s) / UC Diagnoses   Final diagnoses:  Vaginal discharge     Discharge Instructions     Urine did not show signs of infection today. We are testing you for Gonorrhea, Chlamydia, Trichomonas, Yeast and Bacterial Vaginosis. We will call you if anything is positive and let you know if you require any further treatment. Please inform partners  of any positive results.   Please return if symptoms not improving with treatment, development of fever, nausea, vomiting, abdominal pain.    ED Prescriptions    None     Controlled Substance Prescriptions [place] Controlled Substance Registry consulted? Not Applicable   Lew Dawes, [place] 09/03/18 1458

## 2018-09-04 LAB — CERVICOVAGINAL ANCILLARY ONLY
BACTERIAL VAGINITIS: POSITIVE — AB
CHLAMYDIA, DNA PROBE: NEGATIVE
Candida vaginitis: NEGATIVE
NEISSERIA GONORRHEA: NEGATIVE
TRICH (WINDOWPATH): NEGATIVE

## 2018-09-07 ENCOUNTER — Telehealth (HOSPITAL_COMMUNITY): Payer: Self-pay | Admitting: Emergency Medicine

## 2018-09-07 MED ORDER — METRONIDAZOLE 500 MG PO TABS: 500.0000 mg | ORAL_TABLET | Freq: Two times a day (BID) | ORAL | 0 refills | Status: AC

## 2018-09-07 NOTE — Telephone Encounter (Signed)
Patient tested positive for bacterial vaginosis, will call patient to inform patient of results, metronidazole sent to her pharmacy.

## 2019-02-16 IMAGING — US US OB TRANSVAGINAL
1 series · 15 of 28 positions shown · non-contrast
Comparison: None.

CLINICAL DATA: Lower abdominal cramping

EXAM:
OBSTETRIC <14 WK US AND TRANSVAGINAL OB US
TECHNIQUE: Both transabdominal and transvaginal ultrasound examinations were
performed for complete evaluation of the gestation as well as the
maternal uterus, adnexal regions, and pelvic cul-de-sac.
Transvaginal technique was performed to assess early pregnancy.

[Series 1: us ob transvaginal · 48 acquisitions, 15 frames shown]
[im 1/48]
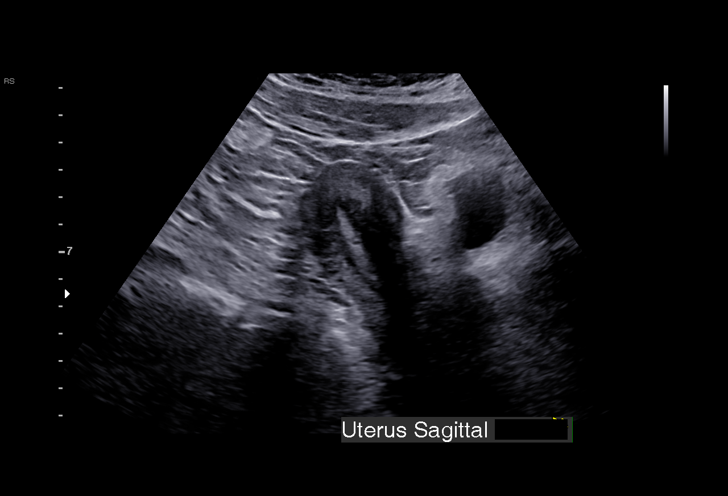
[im 4/48]
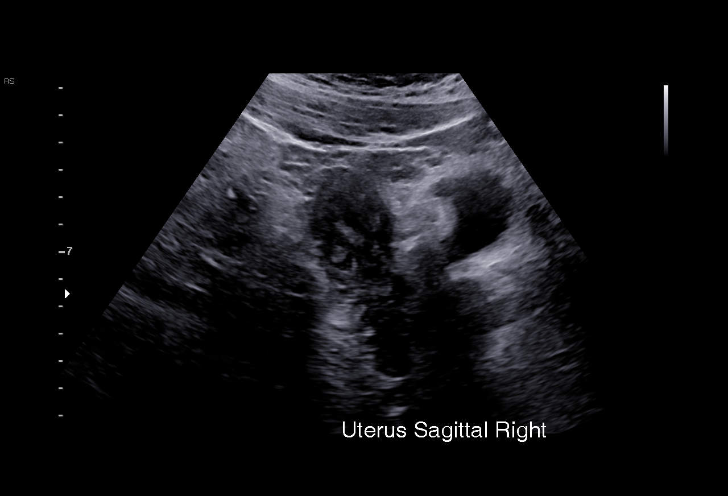
[im 7/48]
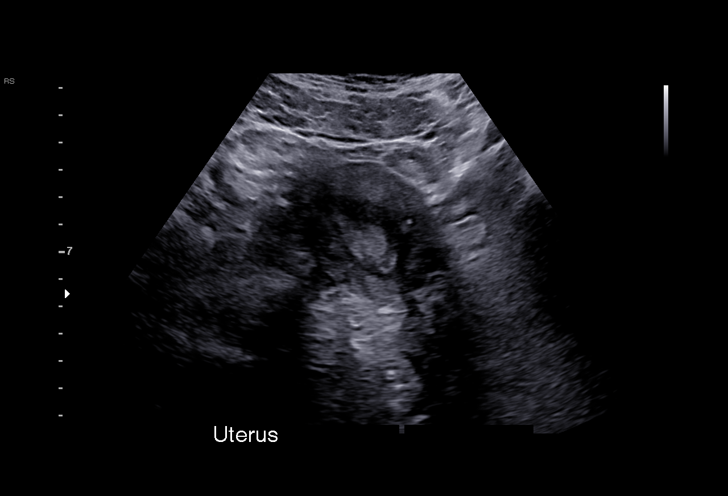
[im 11/48]
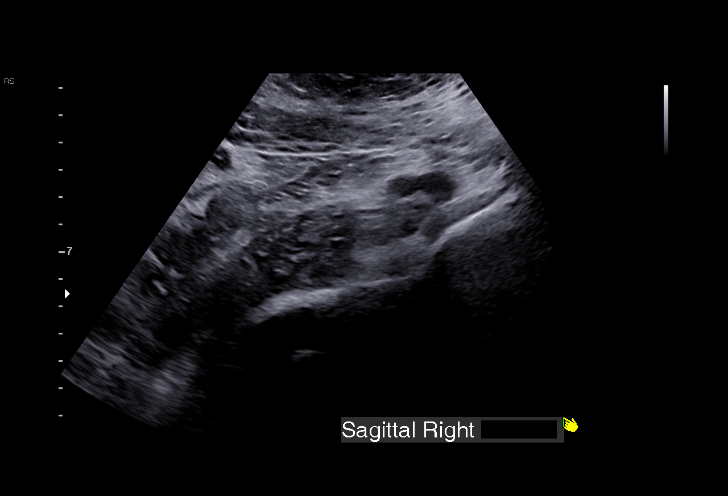
[im 14/48]
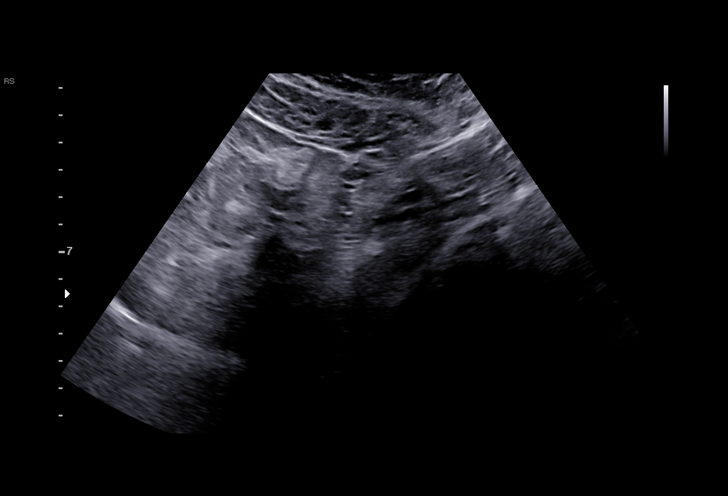
[im 18/48]
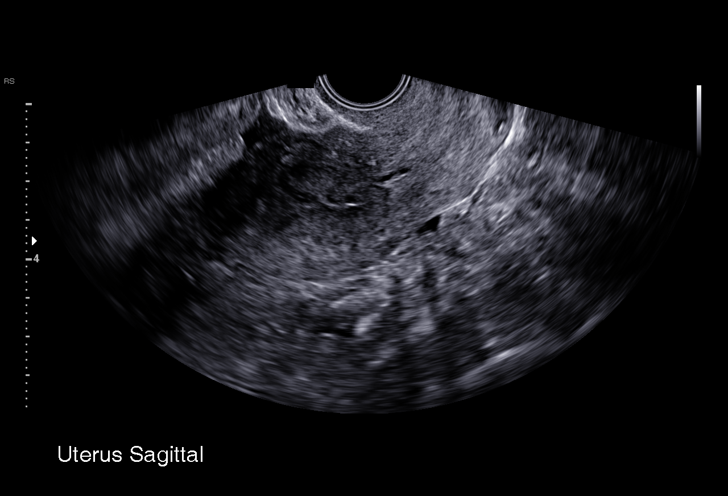
[im 21/48]
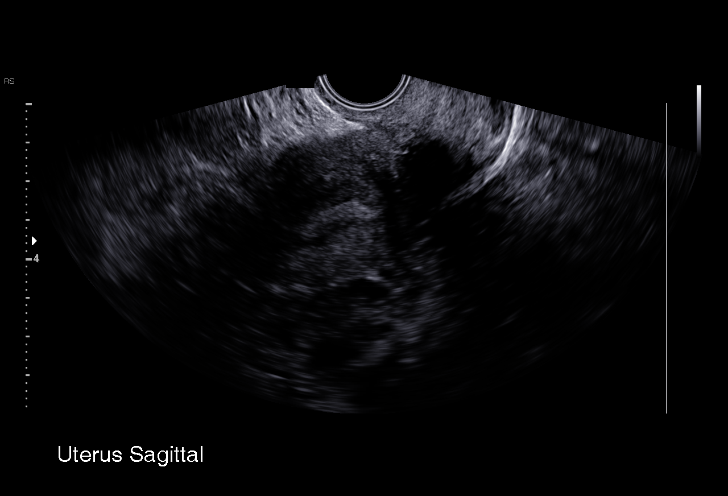
[im 25/48]
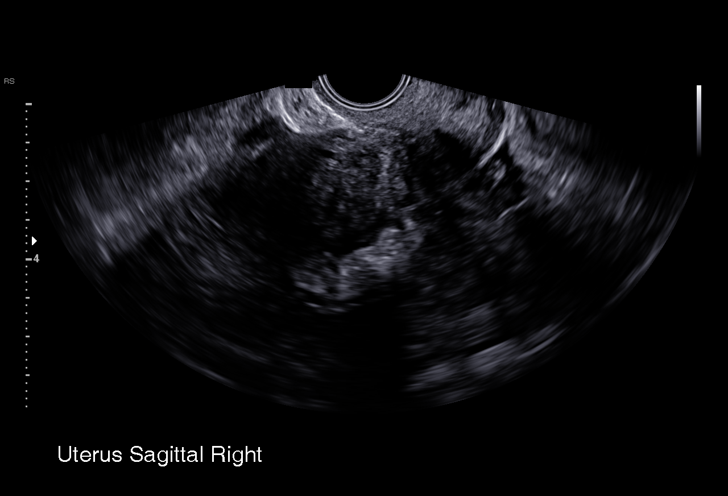
[im 27/48]
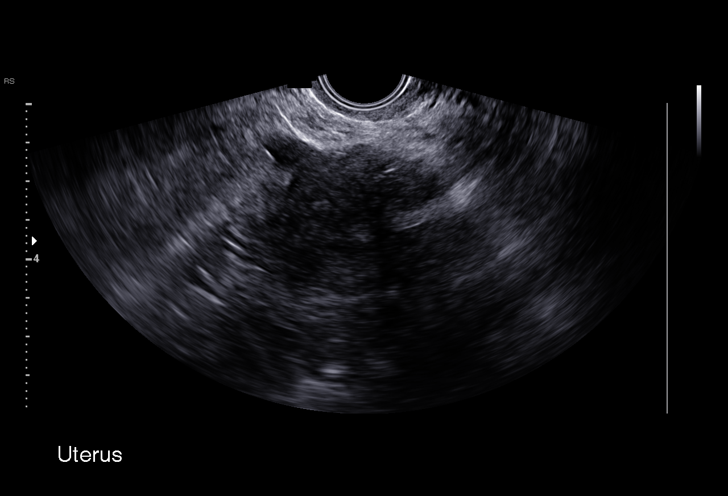
[im 30/48]
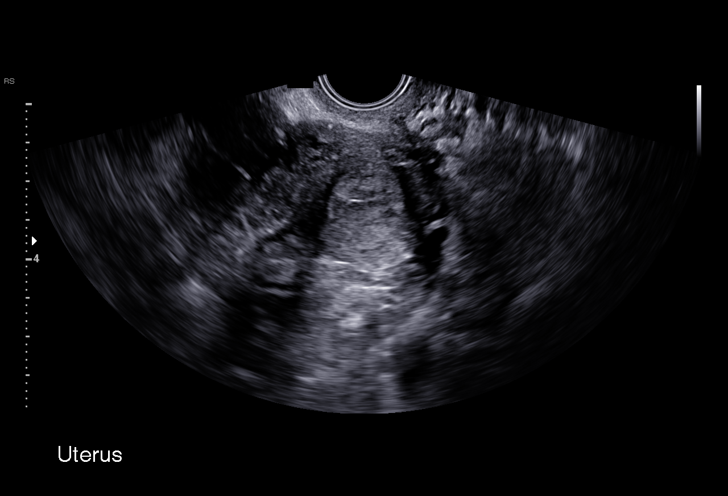
[im 34/48]
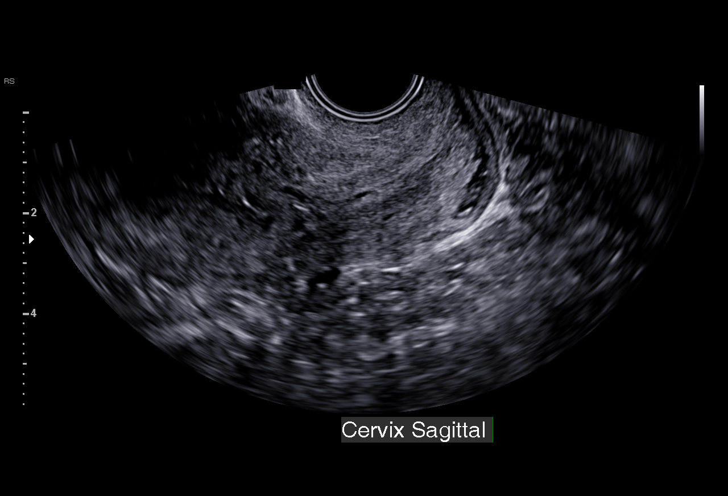
[im 37/48]
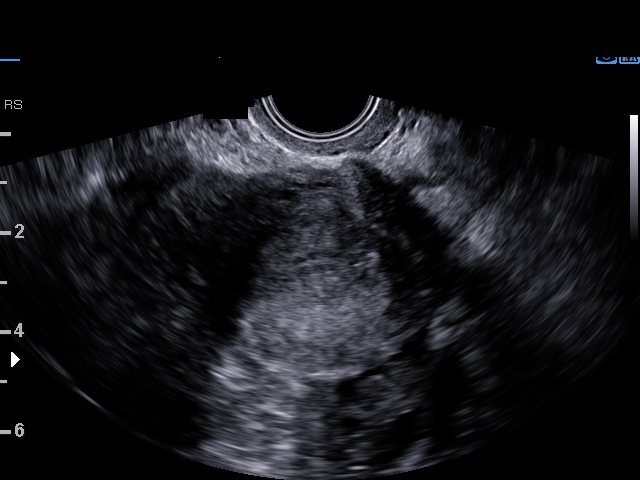
[im 41/48]
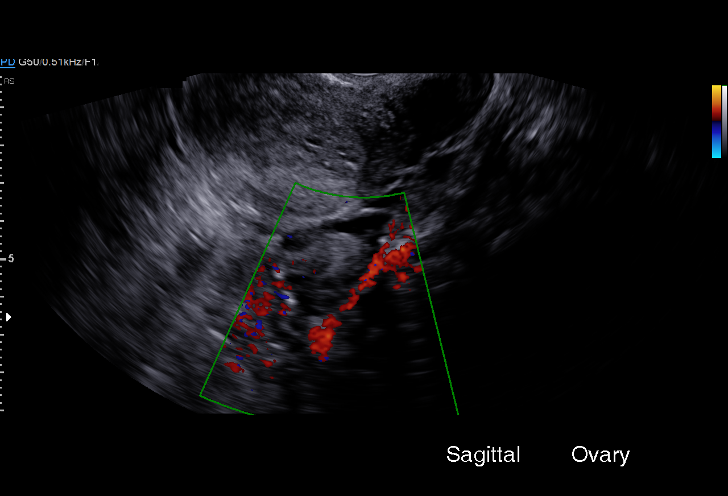
[im 44/48]
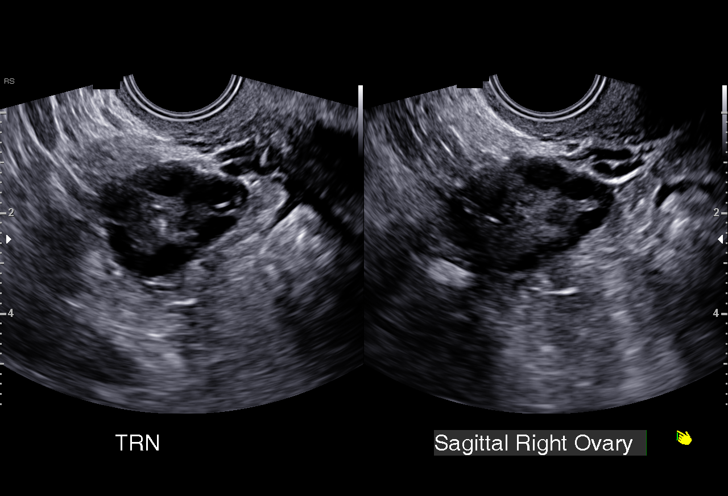
[im 48/48]
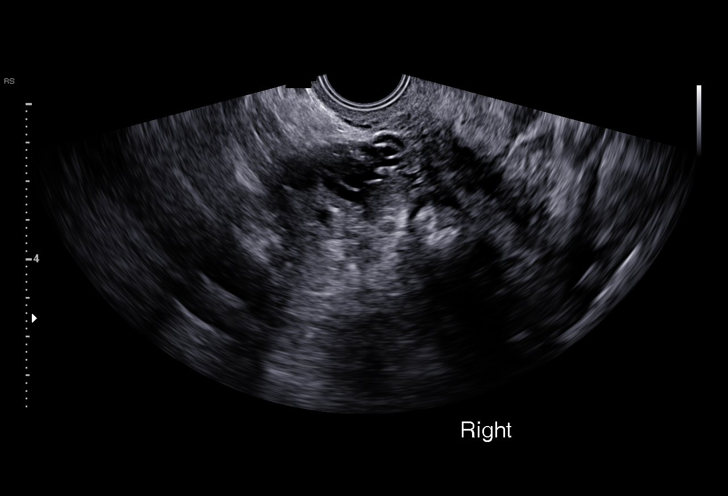

[15 of 28 positions shown; findings below may reference images not displayed]

FINDINGS: Intrauterine gestational sac: None visualized

Yolk sac:  None visualized

Embryo:  None visualized

Cardiac Activity:

Heart Rate:   bpm

MSD:   mm    w     d

CRL:    mm    w    d                  US EDC:

Subchorionic hemorrhage:  None visualized.

Maternal uterus/adnexae: No adnexal mass or free fluid.
IMPRESSION: No intrauterine pregnancy visualized. Differential considerations
would include early intrauterine pregnancy too early to visualize,
spontaneous abortion, or occult ectopic pregnancy. Recommend close
clinical followup and serial quantitative beta HCGs and ultrasounds.

## 2019-02-28 ENCOUNTER — Other Ambulatory Visit: Payer: Self-pay

## 2019-02-28 ENCOUNTER — Emergency Department (HOSPITAL_COMMUNITY): Payer: BLUE CROSS/BLUE SHIELD

## 2019-02-28 ENCOUNTER — Emergency Department (HOSPITAL_COMMUNITY)
Admission: EM | Admit: 2019-02-28 | Discharge: 2019-02-28 | Disposition: A | Discharge status: Home / Self Care | Payer: BLUE CROSS/BLUE SHIELD | Attending: Emergency Medicine | Admitting: Emergency Medicine

## 2019-02-28 ENCOUNTER — Encounter (HOSPITAL_COMMUNITY): Payer: Self-pay | Admitting: Emergency Medicine

## 2019-02-28 DIAGNOSIS — R531 Weakness: Secondary | ICD-10-CM | POA: Insufficient documentation

## 2019-02-28 DIAGNOSIS — R05 Cough: Secondary | ICD-10-CM | POA: Insufficient documentation

## 2019-02-28 DIAGNOSIS — Z79899 Other long term (current) drug therapy: Secondary | ICD-10-CM | POA: Diagnosis not present

## 2019-02-28 DIAGNOSIS — R51 Headache: Secondary | ICD-10-CM | POA: Insufficient documentation

## 2019-02-28 LAB — CBC
HCT: 35.1 % — ABNORMAL LOW (ref 36.0–46.0)
Hemoglobin: 10.5 g/dL — ABNORMAL LOW (ref 12.0–15.0)
MCH: 23.4 pg — AB (ref 26.0–34.0)
MCHC: 29.9 g/dL — AB (ref 30.0–36.0)
MCV: 78.3 fL — ABNORMAL LOW (ref 80.0–100.0)
PLATELETS: 278 10*3/uL (ref 150–400)
RBC: 4.48 MIL/uL (ref 3.87–5.11)
RDW: 17.7 % — ABNORMAL HIGH (ref 11.5–15.5)
WBC: 9.4 10*3/uL (ref 4.0–10.5)
nRBC: 0 % (ref 0.0–0.2)

## 2019-02-28 LAB — I-STAT BETA HCG BLOOD, ED (MC, WL, AP ONLY)

## 2019-02-28 LAB — BASIC METABOLIC PANEL
Anion gap: 9 (ref 5–15)
BUN: 19 mg/dL (ref 6–20)
CO2: 19 mmol/L — ABNORMAL LOW (ref 22–32)
CREATININE: 0.76 mg/dL (ref 0.44–1.00)
Calcium: 8.6 mg/dL — ABNORMAL LOW (ref 8.9–10.3)
Chloride: 110 mmol/L (ref 98–111)
GFR calc Af Amer: >60 mL/min (ref >60)
Glucose, Bld: 92 mg/dL (ref 70–99)
POTASSIUM: 4.2 mmol/L (ref 3.5–5.1)
SODIUM: 138 mmol/L (ref 135–145)

## 2019-02-28 NOTE — ED Triage Notes (Signed)
Patient c/o cough and congestion since diagnosed with bronchitis. Reports prescribed medications helped with cough. Also c/o headache x2 days.

## 2019-02-28 NOTE — ED Provider Notes (Signed)
[facility]ER[facility]ENC[facility]PT Provider Note   CSN: [id number] Arrival date & time: 02/28/19  1918    History   Chief Complaint Chief Complaint  Patient presents with  . Headache  . Cough    HPI Menda Leffall is a 28 y.o. female.     HPI 28 year old female presents the emergency department ongoing cough and congestion over the past 6 days.  She is prescribed cough medicine without improvement in her symptoms.  She also reports mild headache and generalized weakness over the past 2 days.  No myalgias.  Chills without documented fever.  No abdominal pain.  No chest pain.  Denies nausea vomiting diarrhea.  No urinary complaints.  No other complaints at this time.   Past Medical History:  Diagnosis Date  . Abnormal RBC    states it is low  . Kidney disease     Patient Active Problem List   Diagnosis Date Noted  . 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 boil removed       OB History    Gravida  2   Para      Term      Preterm      AB  1   Living        SAB  1   TAB      Ectopic      Multiple      Live Births               Home Medications    Prior to Admission medications   Medication Sig Start Date End Date Taking? Authorizing Provider  amLODipine (NORVASC) 5 MG tablet Take 5 mg by mouth 2 (two) times daily. 08/19/18  Yes [provider]  atorvastatin (LIPITOR) 20 MG tablet Take 20 mg by mouth daily. 08/14/18  Yes [provider]  HYDROcodone-acetaminophen (NORCO/VICODIN) 5-325 MG tablet Take 1 tablet by mouth 3 (three) times daily.   Yes [provider]  ibuprofen (ADVIL,MOTRIN) 200 MG tablet Take 400 mg by mouth every 6 (six) hours as needed.   Yes [provider]  labetalol (NORMODYNE) 100 MG tablet Take 100 mg by mouth daily. 08/13/18  Yes [provider]  naproxen (NAPROSYN) 500 MG tablet Take 500 mg by mouth daily as needed for nausea.    Yes [provider]  valACYclovir (VALTREX) 1000 MG tablet Take 1,000 mg by mouth daily as needed (abscess).  08/04/18  Yes [provider]  metroNIDAZOLE (FLAGYL) 500 MG tablet Take 1 tablet (500 mg total) by mouth 2 (two) times daily. Do not drink alcohol while taking Patient not taking: Reported on 02/28/2019 09/07/18   Lew Dawes, PA-C    Family History Family History  Problem Relation Age of Onset  . Diabetes Father   . Cancer Maternal Uncle   . Cancer Maternal Grandmother     Social History Social History   Tobacco Use  . Smoking status: Never Smoker  . Smokeless tobacco: Never Used  Substance Use Topics  . Alcohol use: No    Frequency: Never  . Drug use: No     Allergies   Lisinopril and Sulfa antibiotics   Review of Systems Review of Systems  All other systems reviewed and are negative.    Physical Exam Updated Vital Signs BP 138/77   Pulse 84   Temp 98 F (36.7 C)   Resp 16   LMP 02/04/2019  SpO2 98%   Physical Exam Vitals signs and nursing note reviewed.  Constitutional:      Appearance: She is well-developed.  HENT:     Head: Normocephalic.  Neck:     Musculoskeletal: Normal range of motion.  Cardiovascular:     Rate and Rhythm: Normal rate.  Pulmonary:     Effort: Pulmonary effort is normal. No respiratory distress.     Breath sounds: Normal breath sounds.  Abdominal:     General: There is no distension.  Musculoskeletal: Normal range of motion.  Neurological:     Mental Status: She is alert and oriented to person, place, and time.      ED Treatments / Results  Labs (all labs ordered are listed, but only abnormal results are displayed) Labs Reviewed  CBC - Abnormal; Notable for the following components:      Result Value   Hemoglobin 10.5 (*)    HCT 35.1 (*)    MCV 78.3 (*)    MCH 23.4 (*)    MCHC 29.9 (*)    RDW 17.7 (*)    All other components within normal limits  BASIC METABOLIC PANEL -  Abnormal; Notable for the following components:   CO2 19 (*)    Calcium 8.6 (*)    All other components within normal limits  I-STAT BETA HCG BLOOD, ED ([facility], [facility], [facility] ONLY)    EKG None  Radiology Dg Chest 2 View  Result Date: 02/28/2019 CLINICAL DATA:  Shortness of breath. Cough and congestion. EXAM: CHEST - 2 VIEW COMPARISON:  None. FINDINGS: The cardiomediastinal contours are normal. The lungs are clear. Pulmonary vasculature is normal. No consolidation, pleural effusion, or pneumothorax. No acute osseous abnormalities are seen. IMPRESSION: Unremarkable radiographs of the chest. Electronically Signed   By: Narda Rutherford M.D.   On: 02/28/2019 20:24    Procedures Procedures (including critical care time)  Medications Ordered in ED Medications - No data to display   Initial Impression / Assessment and Plan / ED Course  I have reviewed the triage vital signs and the nursing notes.  Pertinent labs & imaging results that were available during my care of the patient were reviewed by me and considered in my medical decision making (see chart for details).        Well-appearing.  Reassuring work-up here in the emergency department.  Chest x-ray clear.  Labs without significant abnormality.  Vital signs are reassuring.  No hypoxia.  No indication for additional work-up at this time.  Likely viral upper respiratory tract infection.  No travel history.  No known exposure to COVID-19  Final Clinical Impressions(s) / ED Diagnoses   Final diagnoses:  Generalized weakness    ED Discharge Orders    None       Azalia Bilis, MD 02/28/19 2152

## 2019-07-06 ENCOUNTER — Emergency Department (HOSPITAL_COMMUNITY)
Admission: EM | Admit: 2019-07-06 | Discharge: 2019-07-06 | Disposition: A | Discharge status: Home / Self Care | Payer: BLUE CROSS/BLUE SHIELD | Attending: Emergency Medicine | Admitting: Emergency Medicine

## 2019-07-06 ENCOUNTER — Encounter (HOSPITAL_COMMUNITY): Payer: Self-pay | Admitting: Emergency Medicine

## 2019-07-06 ENCOUNTER — Emergency Department (HOSPITAL_COMMUNITY): Payer: BLUE CROSS/BLUE SHIELD

## 2019-07-06 DIAGNOSIS — S99912A Unspecified injury of left ankle, initial encounter: Secondary | ICD-10-CM | POA: Diagnosis not present

## 2019-07-06 DIAGNOSIS — Y9301 Activity, walking, marching and hiking: Secondary | ICD-10-CM | POA: Diagnosis not present

## 2019-07-06 DIAGNOSIS — X501XXA Overexertion from prolonged static or awkward postures, initial encounter: Secondary | ICD-10-CM | POA: Diagnosis not present

## 2019-07-06 DIAGNOSIS — Y999 Unspecified external cause status: Secondary | ICD-10-CM | POA: Diagnosis not present

## 2019-07-06 DIAGNOSIS — Y92828 Other wilderness area as the place of occurrence of the external cause: Secondary | ICD-10-CM | POA: Diagnosis not present

## 2019-07-06 DIAGNOSIS — Z79899 Other long term (current) drug therapy: Secondary | ICD-10-CM | POA: Diagnosis not present

## 2019-07-06 DIAGNOSIS — M25572 Pain in left ankle and joints of left foot: Secondary | ICD-10-CM

## 2019-07-06 MED ORDER — NAPROXEN 500 MG PO TABS: 500.0000 mg | ORAL_TABLET | Freq: Two times a day (BID) | ORAL | 0 refills | Status: AC

## 2019-07-06 MED ORDER — NAPROXEN 500 MG PO TABS
500.0000 mg | ORAL_TABLET | Freq: Once | ORAL | Status: AC
  Administered 2019-07-06: 500 mg via ORAL
  Filled 2019-07-06: qty 1

## 2019-07-06 NOTE — ED Triage Notes (Signed)
Per EMS-patient was walking in park when her left ankle rotated outwards-having swelling-patient did fall, no LOC-no deformity-unable to bear weight

## 2019-07-06 NOTE — ED Provider Notes (Signed)
[facility]-EMERGENCY DEPT Provider Note   CSN: [id number] Arrival date & time: 07/06/19  1220     History   Chief Complaint Chief Complaint  Patient presents with  . Ankle Pain    HPI Diane Thomas is a 28 y.o. female  Who presents to the ED w/ complaints of L ankle pain s/p injury shortly PTA. Patient was ambulating on a trail when she stepped on the edge resulting in inversion type injury to the L ankle with fall to the ground. Denies head injury, LOC, or prodromal sxs.  Reports isolated injury to the right ankle.  States her pain is moderate in severity, worse with movement, no alleviating factors.  She has not been able to bear weight.  Denies numbness, tingling, or weakness.  Denies chance of pregnancy.     HPI  Past Medical History:  Diagnosis Date  . Abnormal RBC    states it is low  . Kidney disease     Patient Active Problem List   Diagnosis Date Noted  . 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 boil removed       OB History    Gravida  2   Para      Term      Preterm      AB  1   Living        SAB  1   TAB      Ectopic      Multiple      Live Births               Home Medications    Prior to Admission medications   Medication Sig Start Date End Date Taking? Authorizing Provider  amLODipine (NORVASC) 5 MG tablet Take 5 mg by mouth 2 (two) times daily. 08/19/18   [provider]  atorvastatin (LIPITOR) 20 MG tablet Take 20 mg by mouth daily. 08/14/18   [provider]  HYDROcodone-acetaminophen (NORCO/VICODIN) 5-325 MG tablet Take 1 tablet by mouth 3 (three) times daily.    [provider]  ibuprofen (ADVIL,MOTRIN) 200 MG tablet Take 400 mg by mouth every 6 (six) hours as needed.    [provider]  labetalol (NORMODYNE) 100 MG tablet Take 100 mg by mouth daily. 08/13/18   [provider]  metroNIDAZOLE (FLAGYL) 500 MG tablet  Take 1 tablet (500 mg total) by mouth 2 (two) times daily. Do not drink alcohol while taking Patient not taking: Reported on 02/28/2019 09/07/18   Wieters, Hallie C, PA-C  naproxen (NAPROSYN) 500 MG tablet Take 500 mg by mouth daily as needed for nausea.    [provider]  valACYclovir (VALTREX) 1000 MG tablet Take 1,000 mg by mouth daily as needed (abscess).  08/04/18   [provider]    Family History Family History  Problem Relation Age of Onset  . Diabetes Father   . Cancer Maternal Uncle   . Cancer Maternal Grandmother     Social History Social History   Tobacco Use  . Smoking status: Never Smoker  . Smokeless tobacco: Never Used  Substance Use Topics  . Alcohol use: No    Frequency: Never  . Drug use: No     Allergies   Lisinopril and Sulfa antibiotics   Review of Systems Review of Systems  Constitutional: Negative for chills and fever.  Respiratory: Negative for shortness of breath.   Cardiovascular: Negative for chest pain.  Gastrointestinal: Negative for abdominal pain.  Musculoskeletal: Positive for arthralgias. Negative for back pain and neck pain.  Skin: Negative for wound.  Neurological: Negative for dizziness, weakness, light-headedness, numbness and headaches.     Physical Exam Updated Vital Signs BP 128/77   Pulse 100   Temp 98.4 F (36.9 C) (Oral)   Resp 18   LMP 06/06/2019   SpO2 100%   Physical Exam Vitals signs and nursing note reviewed.  Constitutional:      General: She is not in acute distress.    Appearance: She is not ill-appearing or toxic-appearing.  HENT:     Head: Normocephalic and atraumatic.     Comments: No raccoon eyes or battle sign. Eyes:     Extraocular Movements: Extraocular movements intact.  Neck:     Musculoskeletal: Normal range of motion.     Comments: No midline spinal tenderness. Cardiovascular:     Pulses:          Dorsalis pedis pulses are 2+ on the right side and 2+ on the left side.        Posterior tibial pulses are 2+ on the right side and 2+ on the left side.  Pulmonary:     Effort: Pulmonary effort is normal.  Chest:     Chest wall: No tenderness.  Abdominal:     Palpations: Abdomen is soft.     Tenderness: There is no abdominal tenderness.  Musculoskeletal:     Comments: Lower extremities: No obvious deformity, appreciable swelling, edema, erythema, ecchymosis, warmth, or open wounds. Patient has intact AROM to bilateral hips, knees, ankles, and all digits. Tender to palpation to the medial/lateral malleolus & ankle ligaments of the L ankle. Mild tenderness to the mid foot but no focal tenderness to base of the 5th or navicular area. LEs are otherwise nontender. No fibular head tenderness. NVI distally.  Upper extremities: No point/focal bony tenderness. Back: No midline tenderness.  Skin:    General: Skin is warm and dry.     Capillary Refill: Capillary refill takes less than 2 seconds.  Neurological:     Mental Status: She is alert.     Comments: Alert. Clear speech. Sensation grossly intact to bilateral lower extremities. 5/5 strength with plantar/dorsiflexion bilaterally.   Psychiatric:        Mood and Affect: Mood normal.        Behavior: Behavior normal.    ED Treatments / Results  Labs (all labs ordered are listed, but only abnormal results are displayed) Labs Reviewed - No data to display  EKG None  Radiology Dg Ankle Complete Left  Result Date: 07/06/2019 CLINICAL DATA:  Status post fall EXAM: LEFT ANKLE COMPLETE - 3+ VIEW COMPARISON:  None. FINDINGS: There is no evidence of fracture, dislocation, or joint effusion. There is no evidence of arthropathy or other focal bone abnormality. Soft tissues are unremarkable. IMPRESSION: Negative. Electronically Signed   By: Elige KoHetal  Patel   On: 07/06/2019 14:23    Procedures Procedures (including critical care time)  SPLINT APPLICATION Date/Time: 3:12 PM Authorized by: Harvie HeckSamantha Livan Hires Consent: Verbal  consent obtained. Risks and benefits: risks, benefits and alternatives were discussed Consent given by: patient Splint applied by: orthopedic technician Location details: LLE Splint type: ASO Supplies used: ASO Post-procedure: The splinted body part was neurovascularly unchanged following the procedure. Patient tolerance: Patient tolerated the procedure well with no immediate complications.   Medications Ordered in ED Medications  naproxen (NAPROSYN) tablet 500 mg (500 mg Oral Given 07/06/19 1510)  Initial Impression / Assessment and Plan / ED Course  I have reviewed the triage vital signs and the nursing notes.  Pertinent labs & imaging results that were available during my care of the patient were reviewed by me and considered in my medical decision making (see chart for details).   Patient presents to the emergency department status post mechanical fall with left ankle pain.  No prodromal symptoms.  No signs of serious head, neck, back, or intrathoracic/abdominal injury.  Appears to have isolated injury to the left ankle/midfoot.  No signs of septic joint.  X-rays negative for fracture/dislocation.  Neurovascular intact distally.  Will place an ankle ASO with crutches.  Prescription for naproxen.  Recommended PRICE with orthopedics follow up. I discussed results, treatment plan, need for follow-up, and return precautions with the patient. Provided opportunity for questions, patient confirmed understanding and is in agreement with plan.   Final Clinical Impressions(s) / ED Diagnoses   Final diagnoses:  Injury of left ankle, initial encounter    ED Discharge Orders         Ordered    naproxen (NAPROSYN) 500 MG tablet  2 times daily     07/06/19 [address], PA-C 07/06/19 1512    Jola Schmidt, MD 07/07/19 [phone]

## 2019-07-06 NOTE — ED Notes (Signed)
An After Visit Summary was printed and given to the patient. Discharge instructions given and no further questions at this time.  

## 2019-07-06 NOTE — Discharge Instructions (Addendum)
Please read and follow all provided instructions.  You have been seen today for a left ankle injury.   Tests performed today include: An x-ray of the affected area - does NOT show any broken bones or dislocations.  Vital signs. See below for your results today.   Home care instructions: -- *PRICE in the first 24-48 hours after injury: Protect (with brace, splint, sling), if given by your provider Rest Ice- Do not apply ice pack directly to your skin, place towel or similar between your skin and ice/ice pack. Apply ice for 20 min, then remove for 40 min while awake Compression- Wear brace, elastic bandage, splint as directed by your provider Elevate affected extremity above the level of your heart when not walking around for the first 24-48 hours   Medications:  - Naproxen is a nonsteroidal anti-inflammatory medication that will help with pain and swelling. Be sure to take this medication as prescribed with food, 1 pill every 12 hours,  It should be taken with food, as it can cause stomach upset, and more seriously, stomach bleeding. Do not take other nonsteroidal anti-inflammatory medications with this such as Advil, Motrin, Aleve, Mobic, Goodie Powder, or Motrin.    You make take Tylenol per over the counter dosing with these medications.   We have prescribed you new medication(s) today. Discuss the medications prescribed today with your pharmacist as they can have adverse effects and interactions with your other medicines including over the counter and prescribed medications. Seek medical evaluation if you start to experience new or abnormal symptoms after taking one of these medicines, seek care immediately if you start to experience difficulty breathing, feeling of your throat closing, facial swelling, or rash as these could be indications of a more serious allergic reaction   Follow-up instructions: Please follow-up with your primary care provider or the provided orthopedic physician  (bone specialist) if you continue to have significant pain in 1 week. In this case you may have a more severe injury that requires further care.   Return instructions:  Please return if your digits or extremity are numb or tingling, appear gray or blue, or you have severe pain (also elevate the extremity and loosen splint or wrap if you were given one) Please return if you have redness or fevers.  Please return to the Emergency Department if you experience worsening symptoms.  Please return if you have any other emergent concerns. Additional Information:  Your vital signs today were: BP 128/77    Pulse 100    Temp 98.4 F (36.9 C) (Oral)    Resp 18    LMP 06/06/2019 (Approximate)    SpO2 100%  If your blood pressure (BP) was elevated above 135/85 this visit, please have this repeated by your doctor within one month. ---------------

## 2020-10-12 ENCOUNTER — Encounter (HOSPITAL_COMMUNITY): Payer: Self-pay | Admitting: Emergency Medicine

## 2020-10-12 ENCOUNTER — Emergency Department (HOSPITAL_COMMUNITY)
Admission: EM | Admit: 2020-10-12 | Discharge: 2020-10-12 | Disposition: A | Discharge status: Home / Self Care | Payer: BLUE CROSS/BLUE SHIELD | Attending: Emergency Medicine | Admitting: Emergency Medicine

## 2020-10-12 ENCOUNTER — Emergency Department (HOSPITAL_COMMUNITY): Payer: BLUE CROSS/BLUE SHIELD

## 2020-10-12 ENCOUNTER — Other Ambulatory Visit: Payer: Self-pay

## 2020-10-12 DIAGNOSIS — O209 Hemorrhage in early pregnancy, unspecified: Secondary | ICD-10-CM | POA: Diagnosis present

## 2020-10-12 DIAGNOSIS — Z3A00 Weeks of gestation of pregnancy not specified: Secondary | ICD-10-CM | POA: Insufficient documentation

## 2020-10-12 DIAGNOSIS — R1030 Lower abdominal pain, unspecified: Secondary | ICD-10-CM | POA: Insufficient documentation

## 2020-10-12 DIAGNOSIS — O26891 Other specified pregnancy related conditions, first trimester: Secondary | ICD-10-CM | POA: Insufficient documentation

## 2020-10-12 HISTORY — DX: Complete or unspecified spontaneous abortion without complication: O03.9

## 2020-10-12 LAB — BASIC METABOLIC PANEL
Anion gap: 8 (ref 5–15)
BUN: 13 mg/dL (ref 6–20)
CO2: 23 mmol/L (ref 22–32)
Calcium: 8.6 mg/dL — ABNORMAL LOW (ref 8.9–10.3)
Chloride: 105 mmol/L (ref 98–111)
Creatinine, Ser: 0.8 mg/dL (ref 0.44–1.00)
GFR, Estimated: >60 mL/min (ref >60)
Glucose, Bld: 100 mg/dL — ABNORMAL HIGH (ref 70–99)
Potassium: 4.4 mmol/L (ref 3.5–5.1)
Sodium: 136 mmol/L (ref 135–145)

## 2020-10-12 LAB — WET PREP, GENITAL
Clue Cells Wet Prep HPF POC: NONE SEEN
Sperm: NONE SEEN
Trich, Wet Prep: NONE SEEN
Yeast Wet Prep HPF POC: NONE SEEN

## 2020-10-12 LAB — CBC WITH DIFFERENTIAL/PLATELET
Abs Immature Granulocytes: 0.04 10*3/uL (ref 0.00–0.07)
Basophils Absolute: 0 10*3/uL (ref 0.0–0.1)
Basophils Relative: 0 %
Eosinophils Absolute: 0.2 10*3/uL (ref 0.0–0.5)
Eosinophils Relative: 2 %
HCT: 35.2 % — ABNORMAL LOW (ref 36.0–46.0)
Hemoglobin: 10.8 g/dL — ABNORMAL LOW (ref 12.0–15.0)
Immature Granulocytes: 1 %
Lymphocytes Relative: 22 %
Lymphs Abs: 1.7 10*3/uL (ref 0.7–4.0)
MCH: 24.5 pg — ABNORMAL LOW (ref 26.0–34.0)
MCHC: 30.7 g/dL (ref 30.0–36.0)
MCV: 80 fL (ref 80.0–100.0)
Monocytes Absolute: 0.8 10*3/uL (ref 0.1–1.0)
Monocytes Relative: 10 %
Neutro Abs: 4.9 10*3/uL (ref 1.7–7.7)
Neutrophils Relative %: 65 %
Platelets: 247 10*3/uL (ref 150–400)
RBC: 4.4 MIL/uL (ref 3.87–5.11)
RDW: 16 % — ABNORMAL HIGH (ref 11.5–15.5)
WBC: 7.5 10*3/uL (ref 4.0–10.5)
nRBC: 0 % (ref 0.0–0.2)

## 2020-10-12 LAB — HCG, QUANTITATIVE, PREGNANCY: hCG, Beta Chain, Quant, S: 37 m[IU]/mL — ABNORMAL HIGH (ref <5)

## 2020-10-12 NOTE — ED Notes (Signed)
An After Visit Summary was printed and given to the patient. Discharge instructions given and no further questions at this time.  

## 2020-10-12 NOTE — ED Provider Notes (Signed)
[facility]   CSN: [id number] Arrival date & time: 10/12/20  2423     History No chief complaint on file.   Diane Thomas is a 29 y.o. female.  Patient presents the emergency department for evaluation of lower abdominal cramping and vaginal bleeding.  This is occurring in the setting of early pregnancy.  Patient states that her last normal menstrual period was in September.  She had 2 days of abnormal spotting only in October.  Patient had a positive pregnancy test earlier this week.  She started on multivitamins and had GYN follow-up in couple of weeks.  Yesterday she began to have spotting again with mild abdominal cramping.  This morning the bleeding was heavier and ran down her leg when she got out of bed.  She also had more significant lower abdominal cramping.  No lightheadedness, shortness of breath, chest pain.  She denies vomiting but has had some nausea recently.  No diarrhea.  No urinary symptoms.  No treatments prior to arrival.  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  She was due for hysteroscopy in a few weeks after being told that the lining of her uterus was thickened.        Past Medical History:  Diagnosis Date  . Abnormal RBC    states it is low  . Kidney disease   . Miscarriage     Patient Active Problem List   Diagnosis Date Noted  . 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 boil removed       OB History    Gravida  3   Para      Term      Preterm      AB  1   Living        SAB  1   TAB      Ectopic      Multiple      Live Births              Family History  Problem Relation Age of Onset  . Diabetes Father   . Cancer Maternal Uncle   . Cancer Maternal Grandmother     Social History   Tobacco Use  . Smoking status: Never Smoker  . Smokeless tobacco: Never Used  Vaping Use  . Vaping Use: Never used  Substance Use Topics  .  Alcohol use: No  . Drug use: No    Home Medications Prior to Admission medications   Medication Sig Start Date End Date Taking? Authorizing Provider  amLODipine (NORVASC) 5 MG tablet Take 5 mg by mouth 2 (two) times daily. 08/19/18   [provider]  atorvastatin (LIPITOR) 20 MG tablet Take 20 mg by mouth daily. 08/14/18   [provider]  HYDROcodone-acetaminophen (NORCO/VICODIN) 5-325 MG tablet Take 1 tablet by mouth 3 (three) times daily.    [provider]  labetalol (NORMODYNE) 100 MG tablet Take 100 mg by mouth daily. 08/13/18   [provider]  metroNIDAZOLE (FLAGYL) 500 MG tablet Take 1 tablet (500 mg total) by mouth 2 (two) times daily. Do not drink alcohol while taking Patient not taking: Reported on 02/28/2019 09/07/18   Wieters, Hallie C, PA-C  naproxen (NAPROSYN) 500 MG tablet Take 1 tablet (500 mg total) by mouth 2 (two) times daily. 07/06/19   Petrucelli, Pleas Koch, PA-C  valACYclovir (VALTREX) 1000 MG tablet Take 1,000 mg by mouth daily as  needed (abscess).  08/04/18   [provider]    Allergies    Lisinopril and Sulfa antibiotics  Review of Systems   Review of Systems  Constitutional: Negative for fever.  HENT: Negative for rhinorrhea and sore throat.   Eyes: Negative for redness.  Respiratory: Negative for cough.   Cardiovascular: Negative for chest pain.  Gastrointestinal: Positive for abdominal pain and nausea. Negative for diarrhea and vomiting.  Genitourinary: Positive for pelvic pain and vaginal bleeding. Negative for dysuria, frequency, hematuria, urgency and vaginal discharge.  Musculoskeletal: Negative for myalgias.  Skin: Negative for rash.  Neurological: Negative for headaches.    Physical Exam Updated Vital Signs BP 126/75   Pulse 79   Temp 98.6 F (37 C) (Oral)   Resp 16   Ht 5\' 6"  (1.676 m)   Wt (!) 142 kg   SpO2 100%   BMI 50.53 kg/m   Physical Exam Vitals and nursing note reviewed. Exam conducted  with a chaperone present.  Constitutional:      General: She is not in acute distress.    Appearance: She is well-developed.  HENT:     Head: Normocephalic and atraumatic.     Right Ear: External ear normal.     Left Ear: External ear normal.     Nose: Nose normal.  Eyes:     Conjunctiva/sclera: Conjunctivae normal.  Cardiovascular:     Rate and Rhythm: Normal rate and regular rhythm.     Heart sounds: No murmur heard.   Pulmonary:     Effort: No respiratory distress.     Breath sounds: No wheezing, rhonchi or rales.  Abdominal:     Palpations: Abdomen is soft.     Tenderness: There is no abdominal tenderness. There is no guarding or rebound.  Genitourinary:    Exam position: Lithotomy position.     Vagina: Bleeding present.     Cervix: Cervical bleeding present. No cervical motion tenderness.     Uterus: Tender.      Adnexa:        Right: No tenderness.         Left: No tenderness.    Musculoskeletal:     Cervical back: Normal range of motion and neck supple.     Right lower leg: No edema.     Left lower leg: No edema.  Skin:    General: Skin is warm and dry.     Findings: No rash.  Neurological:     General: No focal deficit present.     Mental Status: She is alert. Mental status is at baseline.     Motor: No weakness.  Psychiatric:        Mood and Affect: Mood normal.     ED Results / Procedures / Treatments   Labs (all labs ordered are listed, but only abnormal results are displayed) Labs Reviewed  WET PREP, GENITAL - Abnormal; Notable for the following components:      Result Value   WBC, Wet Prep HPF POC FEW (*)    All other components within normal limits  CBC WITH DIFFERENTIAL/PLATELET - Abnormal; Notable for the following components:   Hemoglobin 10.8 (*)    HCT 35.2 (*)    MCH 24.5 (*)    RDW 16.0 (*)    All other components within normal limits  BASIC METABOLIC PANEL - Abnormal; Notable for the following components:   Glucose, Bld 100 (*)     Calcium 8.6 (*)    All other  components within normal limits  HCG, QUANTITATIVE, PREGNANCY - Abnormal; Notable for the following components:   hCG, Beta Chain, Quant, S 37 (*)    All other components within normal limits  GC/CHLAMYDIA PROBE AMP ([facility]) NOT AT [facility]    EKG None  Radiology US OB Comp < 14 Wks  Result Date: 10/12/2020 CLINICAL DATA:  Vaginal bleeding. EXAM: OBSTETRIC <14 WK [place] AND TRANSVAGINAL OB US TECHNIQUE: Both transabdominal and transvaginal ultrasound examinations were performed for complete evaluation of the gestation as well as the maternal uterus, adnexal regions, and pelvic cul-de-sac. Transvaginal technique was performed to assess early pregnancy. COMPARISON:  Prior ultrasound report 12/03/2017. FINDINGS: Intrauterine gestational sac: None visualized Yolk sac:  None visualized Embryo:  None visualized Cardiac Activity: None visualized Heart Rate:   bpm Subchorionic hemorrhage:  None visualized. Maternal uterus/adnexae: Unremarkable.  No free fluid. IMPRESSION: 1.  No intrauterine pregnancy identified. 2.  Exam otherwise unremarkable. Electronically Signed   By: Maisie Fus  Register   On: 10/12/2020 09:28   US OB Transvaginal  Result Date: 10/12/2020 CLINICAL DATA:  Vaginal bleeding. EXAM: OBSTETRIC <14 WK [place] AND TRANSVAGINAL OB US TECHNIQUE: Both transabdominal and transvaginal ultrasound examinations were performed for complete evaluation of the gestation as well as the maternal uterus, adnexal regions, and pelvic cul-de-sac. Transvaginal technique was performed to assess early pregnancy. COMPARISON:  Prior ultrasound report 12/03/2017. FINDINGS: Intrauterine gestational sac: None visualized Yolk sac:  None visualized Embryo:  None visualized Cardiac Activity: None visualized Heart Rate:   bpm Subchorionic hemorrhage:  None visualized. Maternal uterus/adnexae: Unremarkable.  No free fluid. IMPRESSION: 1.  No intrauterine pregnancy identified. 2.  Exam otherwise  unremarkable. Electronically Signed   By: Maisie Fus  Register   On: 10/12/2020 09:28    Procedures Procedures (including critical care time)  Medications Ordered in ED Medications - No data to display  ED Course  I have reviewed the triage vital signs and the nursing notes.  Pertinent labs & imaging results that were available during my care of the patient were reviewed by me and considered in my medical decision making (see chart for details).  Patient seen and examined. Pelvic exam performed with NT chaperone.  Work-up initiated. Near normal H&H. Quant minimally elevated. Suspect active miscarriage. Awaiting results of [place].   Vital signs reviewed and are as follows: BP 126/75   Pulse 79   Temp 98.6 F (37 C) (Oral)   Resp 16   Ht 5\' 6"  (1.676 m)   Wt (!) 142 kg   SpO2 100%   BMI 50.53 kg/m   10:50 AM ultrasound read as negative.  Patient updated on results.  We discussed her beta-hCG is lower than I would expect at this point given her history.  We discussed that she is likely having a miscarriage, however cannot be certain until beta-hCG is appropriately trended.  She could also have very early pregnancy.  Patient states that she is comfortable discharged home at this time.  Will discharge and I have encouraged her to call her OB/GYN later today and let them know what is going on and schedule a follow-up appointment.  We discussed signs to return including heavier bleeding or development of symptoms such as lightheadedness, dizziness, syncope, chest pain or shortness of breath.  Also discussed return with worsening severe abdominal pain.    MDM Rules/Calculators/A&P                          Patient  who is likely in first trimester pregnancy presents with lower abdominal cramping and bleeding.  She has a history of spontaneous abortion.  This is likely what is occurring today but cannot entirely rule out bleeding in early pregnancy.  No signs of ectopic pregnancy on ultrasound,  however cannot confirm intrauterine pregnancy either.  Her hemoglobin is only slightly low at 10.8.  She is hemodynamically stable.  Plan for discharge to home at this time with close OB/GYN follow-up.  Patient agreeable.   Final Clinical Impression(s) / ED Diagnoses Final diagnoses:  Vaginal bleeding in pregnancy, first trimester    Rx / DC Orders ED Discharge Orders    None       Renne CriglerGeiple, Namari Breton, PA-C 10/12/20 1052    Terrilee FilesButler, Michael C, MD 10/13/20 1031

## 2020-10-12 NOTE — ED Triage Notes (Signed)
Patient states she is pregnant, unsure how far along. Had cramping yesterday and light pink spotting. She woke up to heavy cramping and vaginal bleeding, blood leaking down her leg. Hx of 2 prior miscarriages, no pregnancies to term. Last period October 26th, only a few days of spotting.

## 2020-10-12 NOTE — Discharge Instructions (Signed)
Please read and follow all provided instructions.  Your diagnoses today include:  1. Vaginal bleeding in pregnancy, first trimester     Tests performed today include:  Blood cell counts and platelets  A blood test for pregnancy - level was 37 today  Vital signs. See below for your results today.   Medications prescribed:   None  Take any prescribed medications only as directed.  Home care instructions:   Follow any educational materials contained in this packet.  Follow-up instructions: Call your OB/GYN and schedule follow-up appointment after your discharge.  Return instructions:  SEEK IMMEDIATE MEDICAL ATTENTION IF:  The pain does not go away or becomes severe   A temperature above 101F develops   Repeated vomiting occurs (multiple episodes)   The pain becomes localized to portions of the abdomen. The right side could possibly be appendicitis. In an adult, the left lower portion of the abdomen could be colitis or diverticulitis.   Blood is being passed in stools or vomit (bright red or black tarry stools)   You develop chest pain, difficulty breathing, dizziness or fainting, or become confused, poorly responsive, or inconsolable (young children)  If you have any other emergent concerns regarding your health  Your vital signs today were: BP 108/78 (BP Location: Right Arm)   Pulse 75   Temp 98.6 F (37 C) (Oral)   Resp 17   Ht 5\' 6"  (1.676 m)   Wt (!) 142 kg   SpO2 100%   BMI 50.53 kg/m  If your blood pressure (bp) was elevated above 135/85 this visit, please have this repeated by your doctor within one month. --------------

## 2020-10-13 LAB — GC/CHLAMYDIA PROBE AMP (~~LOC~~) NOT AT ARMC
Chlamydia: NEGATIVE
Comment: NEGATIVE
Comment: NORMAL
Neisseria Gonorrhea: NEGATIVE

## 2020-11-23 ENCOUNTER — Other Ambulatory Visit: Payer: Self-pay | Admitting: Surgical Oncology

## 2020-11-23 DIAGNOSIS — K219 Gastro-esophageal reflux disease without esophagitis: Secondary | ICD-10-CM

## 2020-12-14 ENCOUNTER — Other Ambulatory Visit: Payer: Self-pay | Admitting: Surgical Oncology

## 2020-12-14 ENCOUNTER — Ambulatory Visit
Admission: RE | Admit: 2020-12-14 | Discharge: 2020-12-14 | Disposition: A | Discharge status: Home / Self Care | Payer: BLUE CROSS/BLUE SHIELD | Source: Ambulatory Visit | Attending: Surgical Oncology | Admitting: Surgical Oncology

## 2020-12-14 DIAGNOSIS — K219 Gastro-esophageal reflux disease without esophagitis: Secondary | ICD-10-CM

## 2021-12-26 IMAGING — US US OB TRANSVAGINAL
1 series · 14 of 28 positions shown · non-contrast
Comparison: Prior ultrasound report 12/03/2017.

CLINICAL DATA: Vaginal bleeding.

EXAM:
OBSTETRIC <14 WK US AND TRANSVAGINAL OB US
TECHNIQUE: Both transabdominal and transvaginal ultrasound examinations were
performed for complete evaluation of the gestation as well as the
maternal uterus, adnexal regions, and pelvic cul-de-sac.
Transvaginal technique was performed to assess early pregnancy.

[Series 1: us ob transvaginal · 14 of 40 slices shown]
[im 2/40]
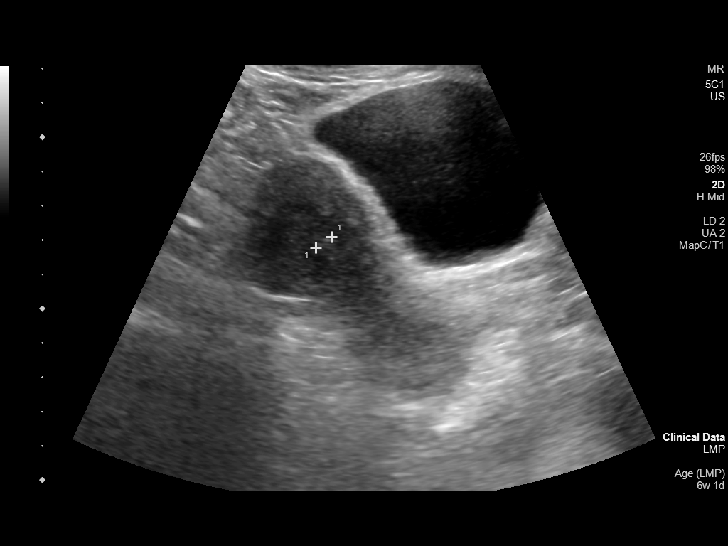
[im 5/40]
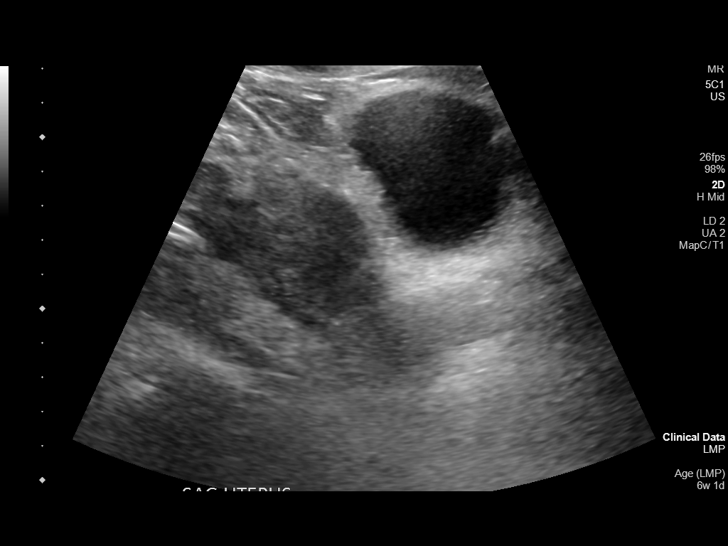
[im 8/40]
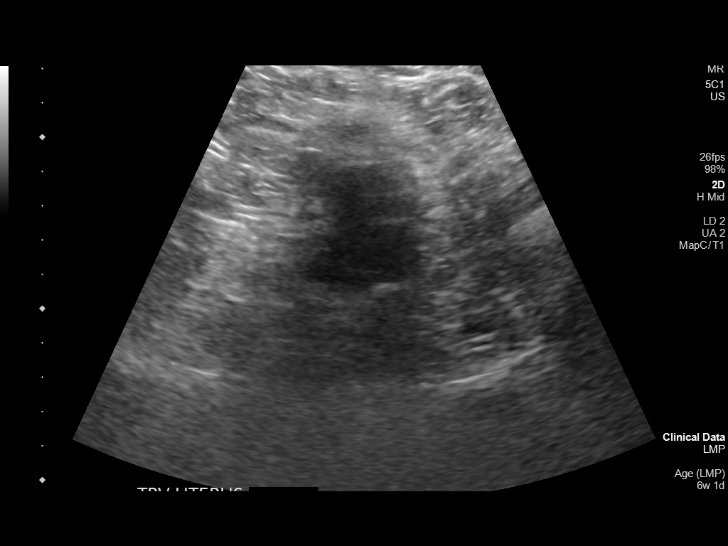
[im 11/40]
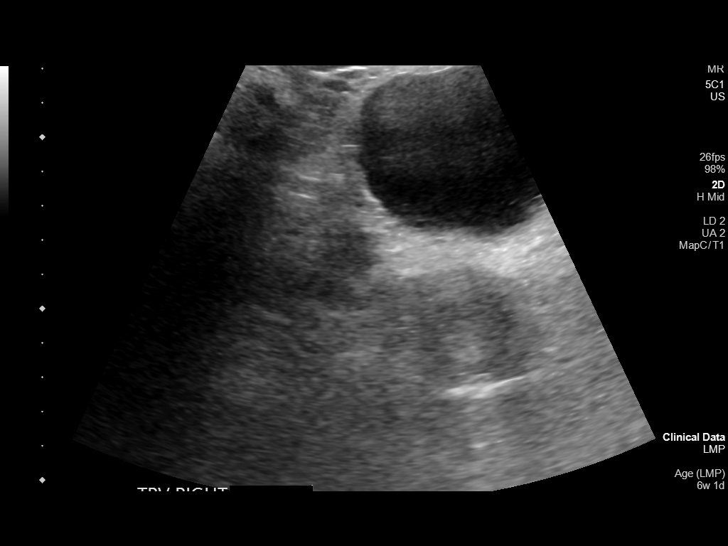
[im 14/40]
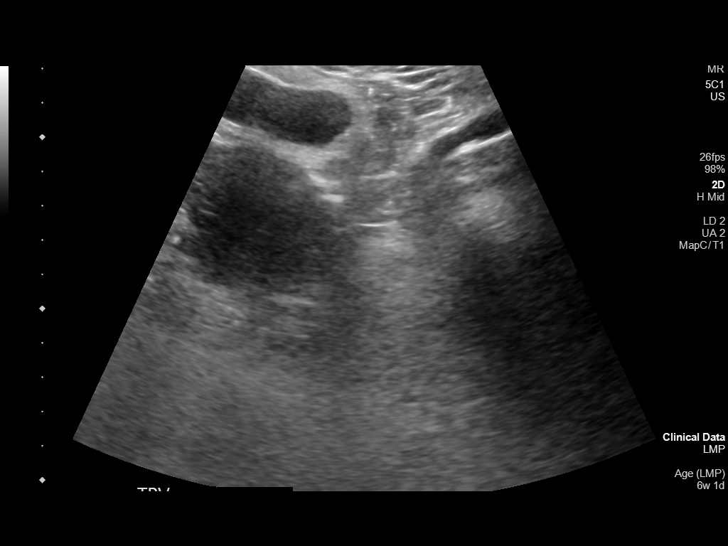
[im 16/40]
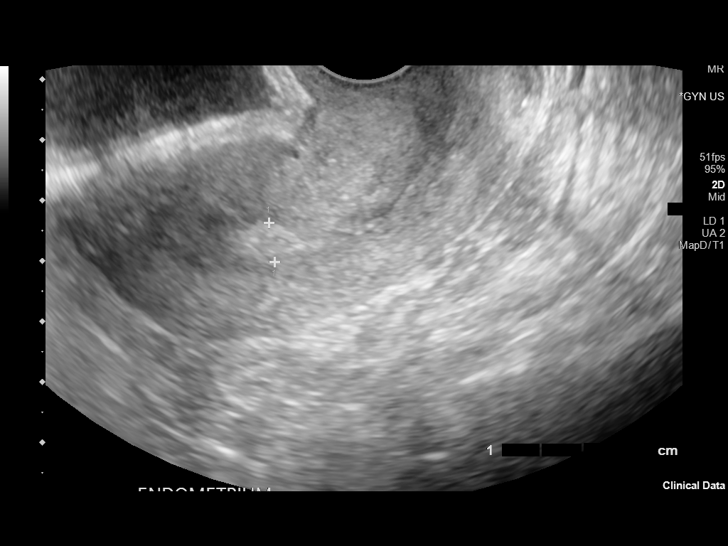
[im 19/40]
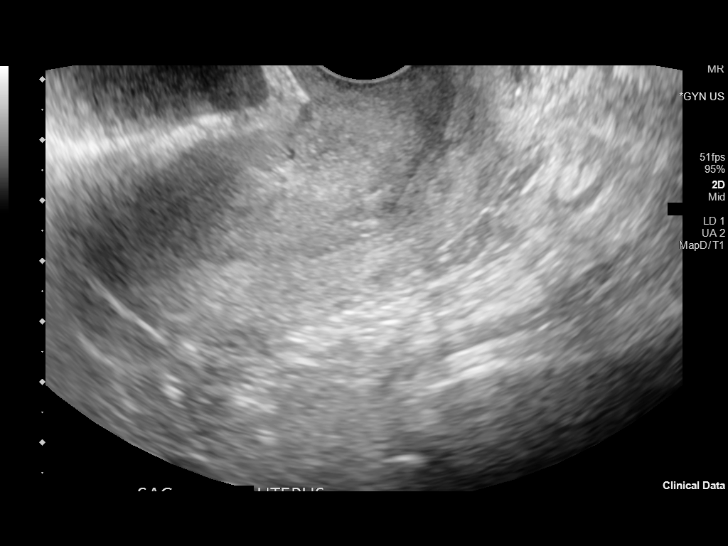
[im 22/40]
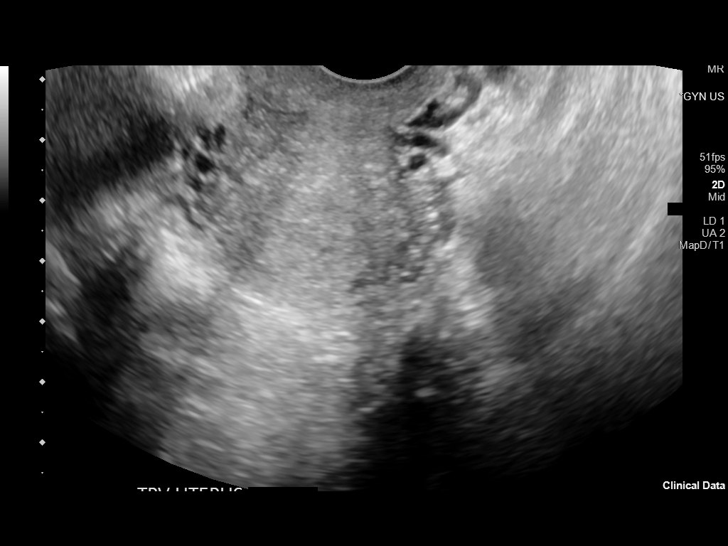
[im 25/40]
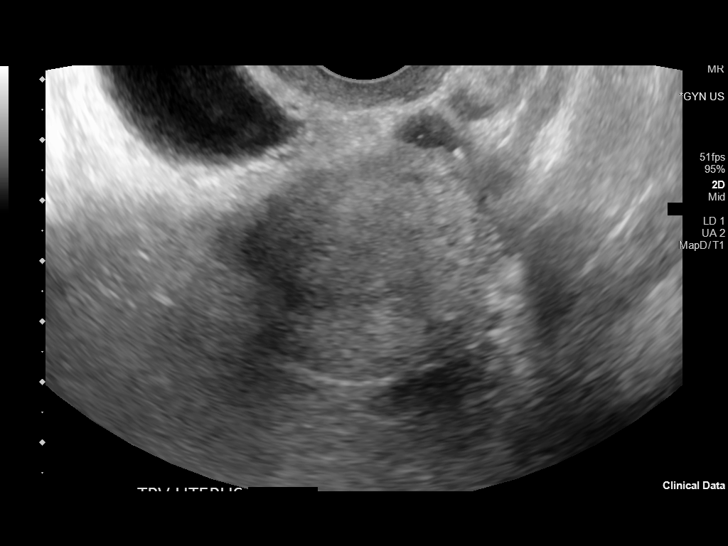
[im 28/40]
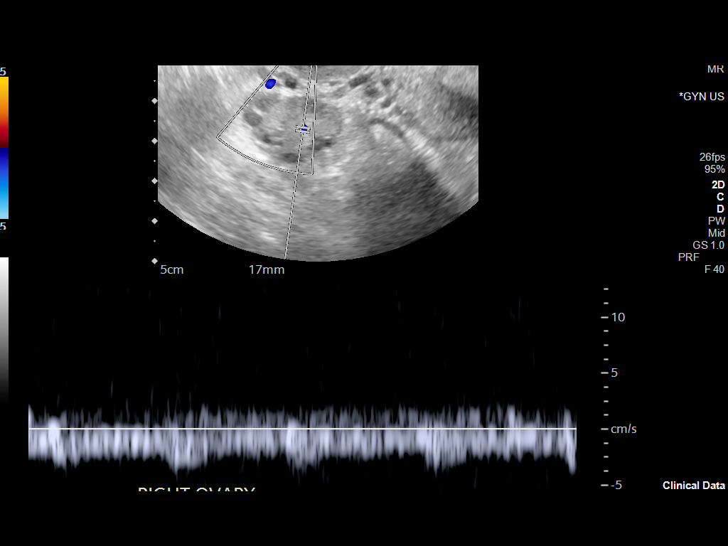
[im 31/40]
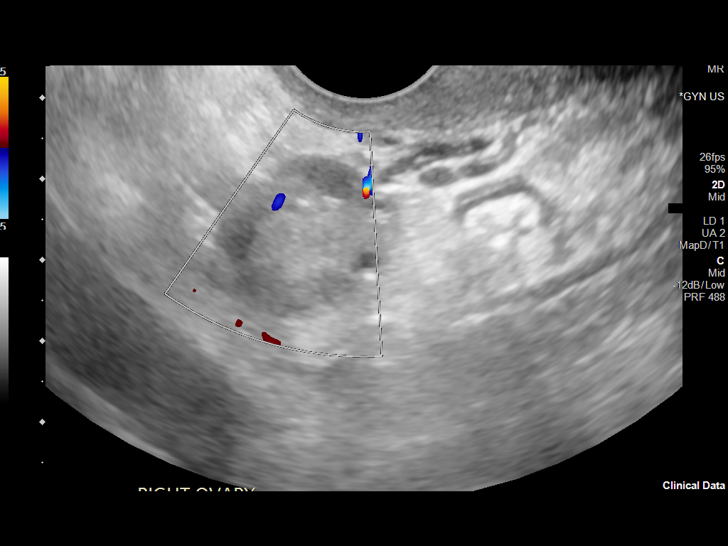
[im 34/40]
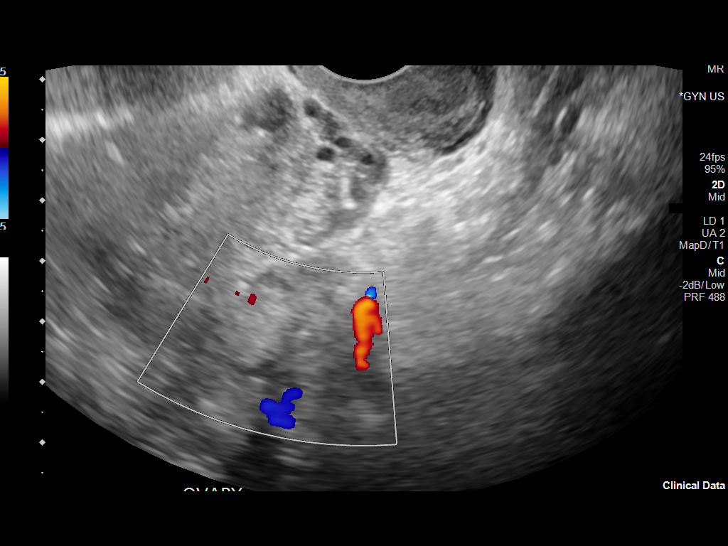
[im 37/40]
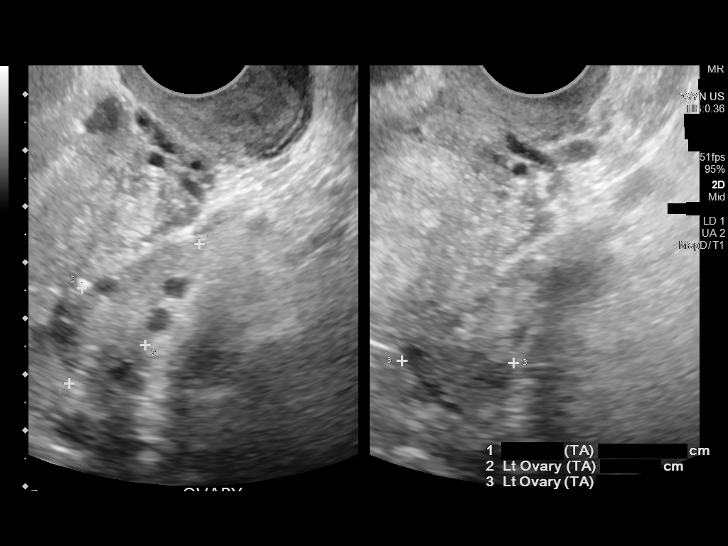
[im 40/40]
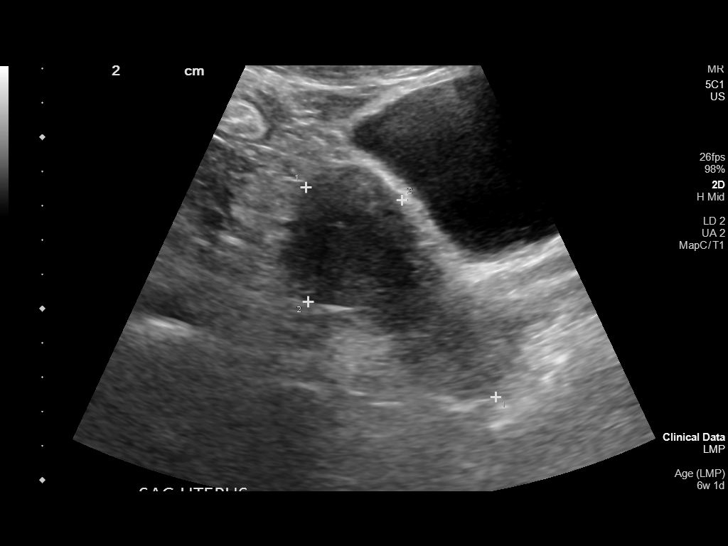

[14 of 28 positions shown; findings below may reference images not displayed]

FINDINGS: Intrauterine gestational sac: None visualized

Yolk sac:  None visualized

Embryo:  None visualized

Cardiac Activity: None visualized

Heart Rate:   bpm

Subchorionic hemorrhage:  None visualized.

Maternal uterus/adnexae: Unremarkable.  No free fluid.
IMPRESSION: 1.  No intrauterine pregnancy identified.

2.  Exam otherwise unremarkable.
